# Patient Record
Sex: Female | Born: 1955 | Race: White | Hispanic: No | Smoking: Former smoker
Health system: Southern US, Community
[De-identification: ages and names within clinical notes are randomized; demographics above are authoritative.]

## PROBLEM LIST (undated history)

## (undated) DIAGNOSIS — R519 Headache, unspecified: Secondary | ICD-10-CM

## (undated) DIAGNOSIS — M5033 Other cervical disc degeneration, cervicothoracic region: Secondary | ICD-10-CM

## (undated) DIAGNOSIS — M5137 Other intervertebral disc degeneration, lumbosacral region: Secondary | ICD-10-CM

## (undated) DIAGNOSIS — M797 Fibromyalgia: Secondary | ICD-10-CM

## (undated) DIAGNOSIS — F329 Major depressive disorder, single episode, unspecified: Secondary | ICD-10-CM

## (undated) DIAGNOSIS — F32A Depression, unspecified: Secondary | ICD-10-CM

## (undated) DIAGNOSIS — M2550 Pain in unspecified joint: Secondary | ICD-10-CM

## (undated) DIAGNOSIS — M1990 Unspecified osteoarthritis, unspecified site: Secondary | ICD-10-CM

## (undated) DIAGNOSIS — R51 Headache: Secondary | ICD-10-CM

## (undated) DIAGNOSIS — M7550 Bursitis of unspecified shoulder: Secondary | ICD-10-CM

## (undated) HISTORY — PX: CARPAL TUNNEL RELEASE: SHX101

## (undated) HISTORY — DX: Fibromyalgia: M79.7

## (undated) HISTORY — PX: TONSILECTOMY, ADENOIDECTOMY, BILATERAL MYRINGOTOMY AND TUBES: SHX2538

## (undated) HISTORY — PX: TONSILLECTOMY: SUR1361

## (undated) HISTORY — DX: Pain in unspecified joint: M25.50

## (undated) HISTORY — DX: Major depressive disorder, single episode, unspecified: F32.9

## (undated) HISTORY — DX: Other intervertebral disc degeneration, lumbosacral region: M51.37

## (undated) HISTORY — DX: Unspecified osteoarthritis, unspecified site: M19.90

## (undated) HISTORY — PX: ABDOMINAL HYSTERECTOMY: SHX81

## (undated) HISTORY — PX: TONSILLECTOMY: SHX5217

## (undated) HISTORY — PX: KNEE SURGERY: SHX244

## (undated) HISTORY — DX: Bursitis of unspecified shoulder: M75.50

## (undated) HISTORY — DX: Other cervical disc degeneration, cervicothoracic region: M50.33

## (undated) HISTORY — DX: Depression, unspecified: F32.A

## (undated) HISTORY — PX: LASIK: SHX215

---

## 1979-02-10 HISTORY — PX: FOOT NEUROMA SURGERY: SHX646

## 1985-02-09 HISTORY — PX: ECTOPIC PREGNANCY SURGERY: SHX613

## 2004-02-10 HISTORY — PX: OTHER SURGICAL HISTORY: SHX169

## 2004-07-08 ENCOUNTER — Emergency Department: Payer: Self-pay | Admitting: Emergency Medicine

## 2004-08-17 ENCOUNTER — Encounter: Admission: RE | Admit: 2004-08-17 | Discharge: 2004-08-17 | Payer: Self-pay | Admitting: Orthopaedic Surgery

## 2004-09-25 ENCOUNTER — Emergency Department: Payer: Self-pay | Admitting: Emergency Medicine

## 2004-11-25 ENCOUNTER — Ambulatory Visit: Payer: Self-pay | Admitting: Internal Medicine

## 2008-01-24 ENCOUNTER — Ambulatory Visit: Payer: Self-pay | Admitting: Family Medicine

## 2008-02-10 HISTORY — PX: ROTATOR CUFF REPAIR: SHX139

## 2008-08-08 ENCOUNTER — Encounter: Admission: RE | Admit: 2008-08-08 | Discharge: 2008-08-08 | Payer: Self-pay | Admitting: Orthopaedic Surgery

## 2012-04-04 ENCOUNTER — Ambulatory Visit: Payer: Self-pay | Admitting: Internal Medicine

## 2012-04-19 ENCOUNTER — Ambulatory Visit (INDEPENDENT_AMBULATORY_CARE_PROVIDER_SITE_OTHER): Payer: Medicare Other | Admitting: Internal Medicine

## 2012-04-19 ENCOUNTER — Encounter: Payer: Self-pay | Admitting: Internal Medicine

## 2012-04-19 ENCOUNTER — Ambulatory Visit (INDEPENDENT_AMBULATORY_CARE_PROVIDER_SITE_OTHER)
Admission: RE | Admit: 2012-04-19 | Discharge: 2012-04-19 | Disposition: A | Discharge status: Home / Self Care | Payer: Medicare Other | Source: Ambulatory Visit | Attending: Internal Medicine | Admitting: Internal Medicine

## 2012-04-19 VITALS — BP 106/70 | HR 64 | Temp 98.3°F | Ht 69.25 in | Wt 153.0 lb

## 2012-04-19 DIAGNOSIS — M47817 Spondylosis without myelopathy or radiculopathy, lumbosacral region: Secondary | ICD-10-CM | POA: Diagnosis not present

## 2012-04-19 DIAGNOSIS — Z1239 Encounter for other screening for malignant neoplasm of breast: Secondary | ICD-10-CM

## 2012-04-19 DIAGNOSIS — M5137 Other intervertebral disc degeneration, lumbosacral region: Secondary | ICD-10-CM | POA: Diagnosis not present

## 2012-04-19 DIAGNOSIS — D649 Anemia, unspecified: Secondary | ICD-10-CM

## 2012-04-19 DIAGNOSIS — M545 Low back pain, unspecified: Secondary | ICD-10-CM

## 2012-04-19 DIAGNOSIS — K5900 Constipation, unspecified: Secondary | ICD-10-CM

## 2012-04-19 DIAGNOSIS — F32A Depression, unspecified: Secondary | ICD-10-CM | POA: Insufficient documentation

## 2012-04-19 DIAGNOSIS — E785 Hyperlipidemia, unspecified: Secondary | ICD-10-CM

## 2012-04-19 DIAGNOSIS — Z1211 Encounter for screening for malignant neoplasm of colon: Secondary | ICD-10-CM

## 2012-04-19 DIAGNOSIS — G8929 Other chronic pain: Secondary | ICD-10-CM | POA: Diagnosis not present

## 2012-04-19 DIAGNOSIS — F329 Major depressive disorder, single episode, unspecified: Secondary | ICD-10-CM

## 2012-04-19 DIAGNOSIS — Z0000 Encounter for general adult medical examination without abnormal findings: Secondary | ICD-10-CM

## 2012-04-19 LAB — CBC WITH DIFFERENTIAL/PLATELET
Basophils Relative: 1.1 % (ref 0.0–3.0)
Eosinophils Relative: 4.6 % (ref 0.0–5.0)
HCT: 39.1 % (ref 36.0–46.0)
Hemoglobin: 13.2 g/dL (ref 12.0–15.0)
Lymphocytes Relative: 27.6 % (ref 12.0–46.0)
Lymphs Abs: 1.7 10*3/uL (ref 0.7–4.0)
MCHC: 33.9 g/dL (ref 30.0–36.0)
MCV: 93 fl (ref 78.0–100.0)
Monocytes Relative: 5.3 % (ref 3.0–12.0)
Neutrophils Relative %: 61.4 % (ref 43.0–77.0)
RBC: 4.2 Mil/uL (ref 3.87–5.11)
WBC: 6.2 10*3/uL (ref 4.5–10.5)

## 2012-04-19 LAB — LIPID PANEL
HDL: 66.5 mg/dL (ref >39.00)
Total CHOL/HDL Ratio: 3
Triglycerides: 103 mg/dL (ref 0.0–149.0)

## 2012-04-19 LAB — COMPREHENSIVE METABOLIC PANEL
ALT: 20 U/L (ref 0–35)
Albumin: 4.4 g/dL (ref 3.5–5.2)
CO2: 26 mEq/L (ref 19–32)
Chloride: 104 mEq/L (ref 96–112)
Sodium: 140 mEq/L (ref 135–145)
Total Protein: 7.2 g/dL (ref 6.0–8.3)

## 2012-04-19 MED ORDER — DULOXETINE HCL 30 MG PO CPEP: 30.0000 mg | ORAL_CAPSULE | Freq: Every day | ORAL | Status: DC

## 2012-04-19 NOTE — Assessment & Plan Note (Signed)
Mammogram to be scheduled 

## 2012-04-19 NOTE — Progress Notes (Signed)
Subjective:    Patient ID: April [name], female    DOB: 1955/02/24, 57 y.o.   MRN: [id number]  HPI 57 year old female with history of fibromyalgia, chronic pain, depression presents to establish care. She reports that she was "fired" by her previous physician because of ongoing issues with chronic pain and medications. She reports that she has pain "all over her "which has been attributed to fibromyalgia. Pain is most prominent in her left shoulder and lower back. 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 This also contributes to pain symptoms. Pain is described as severe aching with occasional cramping. It is not improved with over-the-counter medications or Lyrica. She reports there is an improvement with narcotic medications including Dilaudid. She denies that she is currently using these medications.  She reports ongoing symptoms of depression related to pain issues. She is not currently on any medication for depression. In the past, she took Celexa with some improvement.  Outpatient Encounter Prescriptions as of 04/19/2012  Medication Sig Dispense Refill  . DULoxetine (CYMBALTA) 30 MG capsule Take 1 capsule (30 mg total) by mouth daily.  30 capsule  3   No facility-administered encounter medications on file as of 04/19/2012.   BP 106/70  Pulse 64  Temp(Src) 98.3 F (36.8 C) (Oral)  Ht 5' 9.25" (1.759 m)  Wt 153 lb (69.4 kg)  BMI 22.43 kg/m2  SpO2 99%   Review of Systems  Constitutional: Negative for fever, chills, appetite change, fatigue and unexpected weight change.  HENT: Negative for ear pain, congestion, sore throat, trouble swallowing, neck pain, voice change and sinus pressure.   Eyes: Negative for visual disturbance.  Respiratory: Negative for cough, shortness of breath, wheezing and stridor.   Cardiovascular: Negative for chest pain, palpitations and leg swelling.  Gastrointestinal: Positive for constipation.  Negative for nausea, vomiting, abdominal pain, diarrhea, blood in stool, abdominal distention and anal bleeding.  Genitourinary: Negative for dysuria and flank pain.  Musculoskeletal: Positive for myalgias, back pain and arthralgias. Negative for gait problem.  Skin: Negative for color change and rash.  Neurological: Negative for dizziness and headaches.  Hematological: Negative for adenopathy. Does not bruise/bleed easily.  Psychiatric/Behavioral: Positive for dysphoric mood. Negative for suicidal ideas and sleep disturbance. The patient is not nervous/anxious.        Objective:   Physical Exam  Constitutional: She is oriented to person, place, and time. She appears well-developed and well-nourished. No distress.  HENT:  Head: Normocephalic and atraumatic.  Right Ear: External ear normal.  Left Ear: External ear normal.  Nose: Nose normal.  Mouth/Throat: Oropharynx is clear and moist. No oropharyngeal exudate.  Eyes: Conjunctivae are normal. Pupils are equal, round, and reactive to light. Right eye exhibits no discharge. Left eye exhibits no discharge. No scleral icterus.  Neck: Normal range of motion. Neck supple. No tracheal deviation present. No thyromegaly present.  Cardiovascular: Normal rate, regular rhythm, normal heart sounds and intact distal pulses.  Exam reveals no gallop and no friction rub.   No murmur heard. Pulmonary/Chest: Effort normal and breath sounds normal. No respiratory distress. She has no wheezes. She has no rales. She exhibits no tenderness.  Musculoskeletal: She exhibits no edema and no tenderness.       Right shoulder: She exhibits pain.       Left shoulder: She exhibits pain.       Right knee: She exhibits normal range of motion and no swelling.  Left knee: She exhibits normal range of motion and no swelling.       Lumbar back: She exhibits tenderness, pain and spasm. She exhibits normal range of motion.  Lymphadenopathy:    She has no cervical  adenopathy.  Neurological: She is alert and oriented to person, place, and time. No cranial nerve deficit. She exhibits normal muscle tone. Coordination normal.  Skin: Skin is warm and dry. No rash noted. She is not diaphoretic. No erythema. No pallor.  Psychiatric: Judgment and thought content normal. Her mood appears anxious. Her speech is slurred. She is agitated. She expresses no suicidal ideation. She expresses no suicidal plans.          Assessment & Plan:

## 2012-04-19 NOTE — Assessment & Plan Note (Signed)
Patient reports severe lower back pain which has been refractory to nonsteroidal medications and to Lyrica. 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 Will get plain xray for further evaluation.

## 2012-04-19 NOTE — Assessment & Plan Note (Signed)
Depressed mood likely secondary to ongoing issues with pain control. Will start Cymbalta 30 mg daily. Followup in one month.

## 2012-04-19 NOTE — Assessment & Plan Note (Signed)
Symptoms of chronic constipation. Question if this may be secondary to medications used in the past. Discussed using laxatives such as MiraLax. Will also set up screening colonoscopy.

## 2012-04-19 NOTE — Assessment & Plan Note (Addendum)
Patient with symptoms of diffuse chronic pain, both arthralgia and myalgia. Symptoms are most consistent with fibromyalgia. No improvement with OTC meds, Lyrica. Patient initially suggested the use of narcotic medications to control pain.Discussed with pt that I will not prescribe her narcotic medications.  We discussed using Cymbalta to go to help control pain symptoms and help with depression. Will start this medication and followup in 4 weeks. Will also make referral to pain clinic for ongoing management. Will request records from her previous physician.

## 2012-04-19 NOTE — Assessment & Plan Note (Signed)
Colonoscopy scheduled.

## 2012-04-20 ENCOUNTER — Encounter: Payer: Self-pay | Admitting: *Deleted

## 2012-04-20 ENCOUNTER — Telehealth: Payer: Self-pay | Admitting: *Deleted

## 2012-04-20 LAB — DRUG SCREEN, URINE
Amphetamine Screen, Ur: NEGATIVE
Benzodiazepines.: NEGATIVE
Marijuana Metabolite: NEGATIVE
Opiates: NEGATIVE
Phencyclidine (PCP): NEGATIVE

## 2012-04-20 NOTE — Telephone Encounter (Signed)
There is not a less expensive alternative for Cymbalta. And, she has failed Lyrica and Neurontin in the past. I would recommend waiting until evaluation at pain clinic. We could try prn Tramadol if she has not tried this in the past?

## 2012-04-20 NOTE — Telephone Encounter (Signed)
Pt called saying she would like to change they medication Cymbalta, it will cost $210.99 and to notified of the new medication she will be getting and also that she got a call from the pain clinic, she has an appointment for 04.02.2014

## 2012-04-20 NOTE — Telephone Encounter (Signed)
Pt called about her lab results, pt was notified

## 2012-04-21 MED ORDER — CITALOPRAM HYDROBROMIDE 20 MG PO TABS: 20.0000 mg | ORAL_TABLET | Freq: Every day | ORAL | Status: DC

## 2012-04-21 MED ORDER — TRAMADOL HCL 50 MG PO TABS: 50.0000 mg | ORAL_TABLET | Freq: Two times a day (BID) | ORAL | Status: DC | PRN

## 2012-04-21 NOTE — Telephone Encounter (Addendum)
She has been on Citalopram and she gradually increased it to 40 mg and it was working for the depression but not the pain. But as long as it working for her depression then she would like to go back on it since she does have an appointment with the pain clinic on 05/11/12. Also would like to try the Tramadol as well prn. She was given her xray results and she would like to be referred to PT

## 2012-04-21 NOTE — Telephone Encounter (Signed)
LMTCB

## 2012-04-21 NOTE — Telephone Encounter (Signed)
OK. Fine to re-start Citalopram 20mg  daily. #30 with 3 refills. We will increase to 40mg  in the future. Fine to call in Tramadol 50mg  po bid prn. #60 with 3 refills.

## 2012-04-21 NOTE — Telephone Encounter (Signed)
Patient aware and would like Rx faxed to [facility] in [place]. Rx printed signed and faxed.

## 2012-04-22 ENCOUNTER — Telehealth: Payer: Self-pay | Admitting: Emergency Medicine

## 2012-04-29 DIAGNOSIS — M25569 Pain in unspecified knee: Secondary | ICD-10-CM | POA: Diagnosis not present

## 2012-04-29 DIAGNOSIS — M79609 Pain in unspecified limb: Secondary | ICD-10-CM | POA: Diagnosis not present

## 2012-04-29 DIAGNOSIS — IMO0001 Reserved for inherently not codable concepts without codable children: Secondary | ICD-10-CM | POA: Diagnosis not present

## 2012-04-29 DIAGNOSIS — M47817 Spondylosis without myelopathy or radiculopathy, lumbosacral region: Secondary | ICD-10-CM | POA: Diagnosis not present

## 2012-04-29 DIAGNOSIS — Z79899 Other long term (current) drug therapy: Secondary | ICD-10-CM | POA: Diagnosis not present

## 2012-04-29 DIAGNOSIS — G8929 Other chronic pain: Secondary | ICD-10-CM | POA: Diagnosis not present

## 2012-04-29 DIAGNOSIS — M545 Low back pain, unspecified: Secondary | ICD-10-CM | POA: Diagnosis not present

## 2012-05-04 ENCOUNTER — Ambulatory Visit: Payer: Self-pay | Admitting: Internal Medicine

## 2012-05-04 DIAGNOSIS — Z1231 Encounter for screening mammogram for malignant neoplasm of breast: Secondary | ICD-10-CM | POA: Diagnosis not present

## 2012-05-09 DIAGNOSIS — K5900 Constipation, unspecified: Secondary | ICD-10-CM | POA: Diagnosis not present

## 2012-05-09 DIAGNOSIS — F341 Dysthymic disorder: Secondary | ICD-10-CM | POA: Diagnosis not present

## 2012-05-09 DIAGNOSIS — IMO0002 Reserved for concepts with insufficient information to code with codable children: Secondary | ICD-10-CM | POA: Diagnosis not present

## 2012-05-10 DIAGNOSIS — M75120 Complete rotator cuff tear or rupture of unspecified shoulder, not specified as traumatic: Secondary | ICD-10-CM | POA: Diagnosis not present

## 2012-05-10 DIAGNOSIS — M19019 Primary osteoarthritis, unspecified shoulder: Secondary | ICD-10-CM | POA: Diagnosis not present

## 2012-05-10 DIAGNOSIS — M25519 Pain in unspecified shoulder: Secondary | ICD-10-CM | POA: Diagnosis not present

## 2012-05-19 ENCOUNTER — Encounter: Payer: Self-pay | Admitting: Internal Medicine

## 2012-06-01 DIAGNOSIS — Z1211 Encounter for screening for malignant neoplasm of colon: Secondary | ICD-10-CM | POA: Diagnosis not present

## 2012-06-07 DIAGNOSIS — S46819A Strain of other muscles, fascia and tendons at shoulder and upper arm level, unspecified arm, initial encounter: Secondary | ICD-10-CM | POA: Diagnosis not present

## 2012-06-07 DIAGNOSIS — M25519 Pain in unspecified shoulder: Secondary | ICD-10-CM | POA: Diagnosis not present

## 2012-06-08 ENCOUNTER — Encounter: Payer: Self-pay | Admitting: Emergency Medicine

## 2012-06-13 DIAGNOSIS — L708 Other acne: Secondary | ICD-10-CM | POA: Diagnosis not present

## 2012-06-13 DIAGNOSIS — L739 Follicular disorder, unspecified: Secondary | ICD-10-CM | POA: Diagnosis not present

## 2012-06-13 DIAGNOSIS — L723 Sebaceous cyst: Secondary | ICD-10-CM | POA: Diagnosis not present

## 2012-06-13 DIAGNOSIS — L821 Other seborrheic keratosis: Secondary | ICD-10-CM | POA: Diagnosis not present

## 2012-06-17 DIAGNOSIS — M47817 Spondylosis without myelopathy or radiculopathy, lumbosacral region: Secondary | ICD-10-CM | POA: Diagnosis not present

## 2012-06-17 DIAGNOSIS — IMO0001 Reserved for inherently not codable concepts without codable children: Secondary | ICD-10-CM | POA: Diagnosis not present

## 2012-06-17 DIAGNOSIS — M797 Fibromyalgia: Secondary | ICD-10-CM | POA: Insufficient documentation

## 2012-06-21 ENCOUNTER — Encounter: Payer: Self-pay | Admitting: Emergency Medicine

## 2012-07-12 DIAGNOSIS — G5600 Carpal tunnel syndrome, unspecified upper limb: Secondary | ICD-10-CM | POA: Diagnosis not present

## 2012-07-12 DIAGNOSIS — K5900 Constipation, unspecified: Secondary | ICD-10-CM | POA: Diagnosis not present

## 2012-07-12 DIAGNOSIS — F341 Dysthymic disorder: Secondary | ICD-10-CM | POA: Diagnosis not present

## 2012-07-12 DIAGNOSIS — G4700 Insomnia, unspecified: Secondary | ICD-10-CM | POA: Diagnosis not present

## 2012-07-19 ENCOUNTER — Ambulatory Visit (INDEPENDENT_AMBULATORY_CARE_PROVIDER_SITE_OTHER): Payer: Medicare Other

## 2012-07-19 DIAGNOSIS — G5600 Carpal tunnel syndrome, unspecified upper limb: Secondary | ICD-10-CM

## 2012-07-19 DIAGNOSIS — G5602 Carpal tunnel syndrome, left upper limb: Secondary | ICD-10-CM

## 2012-07-19 NOTE — Procedures (Signed)
  HISTORY:  April [name] is a 57 year old patient with a history of numbness and tingling sensations in the radial aspect of the palm of the left hand. The patient has some pain radiating up the forearm to the shoulder level. The patient is being evaluated for possible carpal tunnel syndrome. The patient has prior history of carpal tunnel surgery on the right side, not the left.  NERVE CONDUCTION STUDIES:  Nerve conduction studies were performed on the left upper extremity. The distal motor latency for the left median nerve was prolonged, with a normal motor amplitude. The distal motor latency and motor amplitudes for the left ulnar nerve were normal. The F wave latencies and nerve conduction velocities for the left median and ulnar nerves were normal. The left median sensory latency was prolonged, normal for the left ulnar nerve.  EMG STUDIES:  EMG evaluation was not performed.  IMPRESSION:   Nerve conduction studies performed on the left upper extremity shows findings consistent with a mild left carpal tunnel syndrome. No other significant abnormalities were seen.  Marlan Palau MD 07/19/2012 1:02 PM  [facility] [address] [place], [place] [address]  Phone [phone] Fax [phone]

## 2012-07-29 DIAGNOSIS — Z0289 Encounter for other administrative examinations: Secondary | ICD-10-CM

## 2012-08-19 ENCOUNTER — Other Ambulatory Visit: Payer: Self-pay | Admitting: Internal Medicine

## 2012-09-06 DIAGNOSIS — M2550 Pain in unspecified joint: Secondary | ICD-10-CM | POA: Diagnosis not present

## 2012-09-06 DIAGNOSIS — F341 Dysthymic disorder: Secondary | ICD-10-CM | POA: Diagnosis not present

## 2012-10-27 DIAGNOSIS — G5600 Carpal tunnel syndrome, unspecified upper limb: Secondary | ICD-10-CM | POA: Diagnosis not present

## 2012-11-08 DIAGNOSIS — G5600 Carpal tunnel syndrome, unspecified upper limb: Secondary | ICD-10-CM | POA: Diagnosis not present

## 2012-12-14 DIAGNOSIS — M25549 Pain in joints of unspecified hand: Secondary | ICD-10-CM | POA: Diagnosis not present

## 2012-12-15 ENCOUNTER — Other Ambulatory Visit: Payer: Self-pay

## 2012-12-30 DIAGNOSIS — M545 Low back pain: Secondary | ICD-10-CM | POA: Diagnosis not present

## 2012-12-30 DIAGNOSIS — R42 Dizziness and giddiness: Secondary | ICD-10-CM | POA: Diagnosis not present

## 2013-01-11 DIAGNOSIS — M25559 Pain in unspecified hip: Secondary | ICD-10-CM | POA: Diagnosis not present

## 2013-01-11 DIAGNOSIS — M25569 Pain in unspecified knee: Secondary | ICD-10-CM | POA: Diagnosis not present

## 2013-01-23 DIAGNOSIS — M79609 Pain in unspecified limb: Secondary | ICD-10-CM | POA: Diagnosis not present

## 2013-01-25 DIAGNOSIS — M5137 Other intervertebral disc degeneration, lumbosacral region: Secondary | ICD-10-CM | POA: Diagnosis not present

## 2013-01-25 DIAGNOSIS — M961 Postlaminectomy syndrome, not elsewhere classified: Secondary | ICD-10-CM | POA: Diagnosis not present

## 2013-01-25 DIAGNOSIS — M542 Cervicalgia: Secondary | ICD-10-CM | POA: Diagnosis not present

## 2013-01-27 DIAGNOSIS — M5137 Other intervertebral disc degeneration, lumbosacral region: Secondary | ICD-10-CM | POA: Diagnosis not present

## 2013-01-30 DIAGNOSIS — M5137 Other intervertebral disc degeneration, lumbosacral region: Secondary | ICD-10-CM | POA: Diagnosis not present

## 2013-02-06 DIAGNOSIS — M5137 Other intervertebral disc degeneration, lumbosacral region: Secondary | ICD-10-CM | POA: Diagnosis not present

## 2013-02-13 DIAGNOSIS — M5137 Other intervertebral disc degeneration, lumbosacral region: Secondary | ICD-10-CM | POA: Diagnosis not present

## 2013-02-22 DIAGNOSIS — M25569 Pain in unspecified knee: Secondary | ICD-10-CM | POA: Diagnosis not present

## 2013-02-23 DIAGNOSIS — M961 Postlaminectomy syndrome, not elsewhere classified: Secondary | ICD-10-CM | POA: Diagnosis not present

## 2013-02-23 DIAGNOSIS — M545 Low back pain, unspecified: Secondary | ICD-10-CM | POA: Diagnosis not present

## 2013-02-23 DIAGNOSIS — M47817 Spondylosis without myelopathy or radiculopathy, lumbosacral region: Secondary | ICD-10-CM | POA: Diagnosis not present

## 2013-02-23 DIAGNOSIS — M461 Sacroiliitis, not elsewhere classified: Secondary | ICD-10-CM | POA: Diagnosis not present

## 2013-03-01 DIAGNOSIS — M25569 Pain in unspecified knee: Secondary | ICD-10-CM | POA: Diagnosis not present

## 2013-03-01 DIAGNOSIS — M47817 Spondylosis without myelopathy or radiculopathy, lumbosacral region: Secondary | ICD-10-CM | POA: Diagnosis not present

## 2013-03-16 DIAGNOSIS — M545 Low back pain, unspecified: Secondary | ICD-10-CM | POA: Diagnosis not present

## 2013-03-16 DIAGNOSIS — M461 Sacroiliitis, not elsewhere classified: Secondary | ICD-10-CM | POA: Diagnosis not present

## 2013-03-16 DIAGNOSIS — M961 Postlaminectomy syndrome, not elsewhere classified: Secondary | ICD-10-CM | POA: Diagnosis not present

## 2013-03-16 DIAGNOSIS — M47817 Spondylosis without myelopathy or radiculopathy, lumbosacral region: Secondary | ICD-10-CM | POA: Diagnosis not present

## 2013-03-22 DIAGNOSIS — M461 Sacroiliitis, not elsewhere classified: Secondary | ICD-10-CM | POA: Diagnosis not present

## 2013-03-27 DIAGNOSIS — M545 Low back pain, unspecified: Secondary | ICD-10-CM | POA: Diagnosis not present

## 2013-03-27 DIAGNOSIS — F341 Dysthymic disorder: Secondary | ICD-10-CM | POA: Diagnosis not present

## 2013-03-27 DIAGNOSIS — Z23 Encounter for immunization: Secondary | ICD-10-CM | POA: Diagnosis not present

## 2013-03-27 DIAGNOSIS — L989 Disorder of the skin and subcutaneous tissue, unspecified: Secondary | ICD-10-CM | POA: Diagnosis not present

## 2013-04-20 DIAGNOSIS — M545 Low back pain, unspecified: Secondary | ICD-10-CM | POA: Diagnosis not present

## 2013-04-20 DIAGNOSIS — M542 Cervicalgia: Secondary | ICD-10-CM | POA: Diagnosis not present

## 2013-04-25 DIAGNOSIS — M5137 Other intervertebral disc degeneration, lumbosacral region: Secondary | ICD-10-CM | POA: Diagnosis not present

## 2013-04-25 DIAGNOSIS — M542 Cervicalgia: Secondary | ICD-10-CM | POA: Diagnosis not present

## 2013-04-25 DIAGNOSIS — M51379 Other intervertebral disc degeneration, lumbosacral region without mention of lumbar back pain or lower extremity pain: Secondary | ICD-10-CM | POA: Diagnosis not present

## 2013-04-28 ENCOUNTER — Other Ambulatory Visit: Payer: Self-pay | Admitting: Orthopedic Surgery

## 2013-04-28 DIAGNOSIS — M549 Dorsalgia, unspecified: Secondary | ICD-10-CM

## 2013-04-28 DIAGNOSIS — F341 Dysthymic disorder: Secondary | ICD-10-CM | POA: Diagnosis not present

## 2013-04-28 DIAGNOSIS — IMO0002 Reserved for concepts with insufficient information to code with codable children: Secondary | ICD-10-CM | POA: Diagnosis not present

## 2013-05-03 ENCOUNTER — Other Ambulatory Visit: Payer: Self-pay | Admitting: Orthopedic Surgery

## 2013-05-03 ENCOUNTER — Inpatient Hospital Stay
Admission: RE | Admit: 2013-05-03 | Discharge: 2013-05-03 | Disposition: A | Discharge status: Home / Self Care | Payer: Self-pay | Source: Ambulatory Visit | Attending: Orthopedic Surgery | Admitting: Orthopedic Surgery

## 2013-05-03 ENCOUNTER — Ambulatory Visit
Admission: RE | Admit: 2013-05-03 | Discharge: 2013-05-03 | Disposition: A | Discharge status: Home / Self Care | Payer: Medicare Other | Source: Ambulatory Visit | Attending: Orthopedic Surgery | Admitting: Orthopedic Surgery

## 2013-05-03 VITALS — BP 146/84 | HR 71 | Resp 14

## 2013-05-03 DIAGNOSIS — M549 Dorsalgia, unspecified: Secondary | ICD-10-CM

## 2013-05-03 DIAGNOSIS — M5137 Other intervertebral disc degeneration, lumbosacral region: Secondary | ICD-10-CM | POA: Diagnosis not present

## 2013-05-03 DIAGNOSIS — M545 Low back pain, unspecified: Secondary | ICD-10-CM

## 2013-05-03 DIAGNOSIS — M48061 Spinal stenosis, lumbar region without neurogenic claudication: Secondary | ICD-10-CM | POA: Diagnosis not present

## 2013-05-03 MED ORDER — ONDANSETRON HCL 4 MG/2ML IJ SOLN
4.0000 mg | Freq: Once | INTRAMUSCULAR | Status: AC
Start: 2013-05-03 — End: 2013-05-03
  Administered 2013-05-03: 4 mg via INTRAMUSCULAR

## 2013-05-03 MED ORDER — FENTANYL CITRATE 0.05 MG/ML IJ SOLN
25.0000 ug | INTRAMUSCULAR | Status: DC | PRN
  Administered 2013-05-03: 100 ug via INTRAVENOUS

## 2013-05-03 MED ORDER — IOHEXOL 180 MG/ML  SOLN
4.0000 mL | Freq: Once | INTRAMUSCULAR | Status: AC | PRN
  Administered 2013-05-03: 4 mL

## 2013-05-03 MED ORDER — KETOROLAC TROMETHAMINE 30 MG/ML IJ SOLN
30.0000 mg | Freq: Once | INTRAMUSCULAR | Status: AC
  Administered 2013-05-03: 30 mg via INTRAVENOUS

## 2013-05-03 MED ORDER — MIDAZOLAM HCL 2 MG/2ML IJ SOLN
1.0000 mg | INTRAMUSCULAR | Status: DC | PRN
  Administered 2013-05-03: 1 mg via INTRAVENOUS

## 2013-05-03 MED ORDER — MEPERIDINE HCL 100 MG/ML IJ SOLN
75.0000 mg | Freq: Once | INTRAMUSCULAR | Status: AC
  Administered 2013-05-03: 75 mg via INTRAMUSCULAR

## 2013-05-03 MED ORDER — SODIUM CHLORIDE 0.9 % IV SOLN
Freq: Once | INTRAVENOUS | Status: AC
  Administered 2013-05-03: 08:00:00 via INTRAVENOUS

## 2013-05-03 MED ORDER — CEFAZOLIN SODIUM-DEXTROSE 2-3 GM-% IV SOLR
2.0000 g | Freq: Once | INTRAVENOUS | Status: AC
  Administered 2013-05-03: 2 g via INTRAVENOUS

## 2013-05-03 NOTE — Discharge Instructions (Signed)
Discogram Post Procedure Discharge Instructions ° °1. May resume a regular diet and any medications that you routinely take (including pain medications). °2. No driving day of procedure. °3. Upon discharge go home and rest for at least 4 hours.  May use an ice pack as needed to injection sites on back.  Ice to back 30 minutes on and 30 minutes off, all day. °4. May remove bandades later, today. °5. It is not unusual to be sore for several days after this procedure. ° ° ° °Please contact our office at [phone] for the following symptoms: ° °· Fever greater than 100 degrees °· Increased swelling, pain, or redness at injection site. ° ° °Thank you for visiting [facility]. ° ° °

## 2013-05-18 DIAGNOSIS — M25569 Pain in unspecified knee: Secondary | ICD-10-CM | POA: Diagnosis not present

## 2013-05-18 DIAGNOSIS — M2240 Chondromalacia patellae, unspecified knee: Secondary | ICD-10-CM | POA: Diagnosis not present

## 2013-05-18 DIAGNOSIS — Y929 Unspecified place or not applicable: Secondary | ICD-10-CM | POA: Diagnosis not present

## 2013-05-18 DIAGNOSIS — G8918 Other acute postprocedural pain: Secondary | ICD-10-CM | POA: Diagnosis not present

## 2013-05-18 DIAGNOSIS — X58XXXA Exposure to other specified factors, initial encounter: Secondary | ICD-10-CM | POA: Diagnosis not present

## 2013-05-18 DIAGNOSIS — M23305 Other meniscus derangements, unspecified medial meniscus, unspecified knee: Secondary | ICD-10-CM | POA: Diagnosis not present

## 2013-05-18 DIAGNOSIS — M1710 Unilateral primary osteoarthritis, unspecified knee: Secondary | ICD-10-CM | POA: Diagnosis not present

## 2013-05-18 DIAGNOSIS — M9420 Chondromalacia, unspecified site: Secondary | ICD-10-CM | POA: Diagnosis not present

## 2013-05-18 DIAGNOSIS — IMO0002 Reserved for concepts with insufficient information to code with codable children: Secondary | ICD-10-CM | POA: Diagnosis not present

## 2013-06-23 DIAGNOSIS — M129 Arthropathy, unspecified: Secondary | ICD-10-CM | POA: Diagnosis not present

## 2013-06-23 DIAGNOSIS — Z9889 Other specified postprocedural states: Secondary | ICD-10-CM | POA: Diagnosis not present

## 2013-06-23 DIAGNOSIS — M25569 Pain in unspecified knee: Secondary | ICD-10-CM | POA: Diagnosis not present

## 2013-09-12 DIAGNOSIS — M542 Cervicalgia: Secondary | ICD-10-CM | POA: Diagnosis not present

## 2013-09-15 ENCOUNTER — Other Ambulatory Visit: Payer: Self-pay | Admitting: Orthopedic Surgery

## 2013-09-15 DIAGNOSIS — M5020 Other cervical disc displacement, unspecified cervical region: Secondary | ICD-10-CM

## 2013-09-15 DIAGNOSIS — M48061 Spinal stenosis, lumbar region without neurogenic claudication: Secondary | ICD-10-CM

## 2013-09-15 DIAGNOSIS — M542 Cervicalgia: Secondary | ICD-10-CM

## 2013-09-26 ENCOUNTER — Other Ambulatory Visit: Payer: Medicare Other

## 2013-10-03 ENCOUNTER — Ambulatory Visit
Admission: RE | Admit: 2013-10-03 | Discharge: 2013-10-03 | Disposition: A | Discharge status: Home / Self Care | Payer: Medicare Other | Source: Ambulatory Visit | Attending: Orthopedic Surgery | Admitting: Orthopedic Surgery

## 2013-10-03 ENCOUNTER — Ambulatory Visit
Admission: RE | Admit: 2013-10-03 | Discharge: 2013-10-03 | Disposition: A | Discharge status: Home / Self Care | Payer: Self-pay | Source: Ambulatory Visit | Attending: Orthopedic Surgery | Admitting: Orthopedic Surgery

## 2013-10-03 ENCOUNTER — Other Ambulatory Visit: Payer: Self-pay | Admitting: Orthopedic Surgery

## 2013-10-03 VITALS — BP 141/81 | HR 62

## 2013-10-03 DIAGNOSIS — M542 Cervicalgia: Secondary | ICD-10-CM

## 2013-10-03 DIAGNOSIS — M5020 Other cervical disc displacement, unspecified cervical region: Secondary | ICD-10-CM

## 2013-10-03 DIAGNOSIS — M47812 Spondylosis without myelopathy or radiculopathy, cervical region: Secondary | ICD-10-CM | POA: Diagnosis not present

## 2013-10-03 DIAGNOSIS — M4802 Spinal stenosis, cervical region: Secondary | ICD-10-CM | POA: Diagnosis not present

## 2013-10-03 DIAGNOSIS — M4310 Spondylolisthesis, site unspecified: Secondary | ICD-10-CM | POA: Diagnosis not present

## 2013-10-03 DIAGNOSIS — M48061 Spinal stenosis, lumbar region without neurogenic claudication: Secondary | ICD-10-CM

## 2013-10-03 MED ORDER — IOHEXOL 300 MG/ML  SOLN
10.0000 mL | Freq: Once | INTRAMUSCULAR | Status: AC | PRN
  Administered 2013-10-03: 10 mL via INTRATHECAL

## 2013-10-03 MED ORDER — DIAZEPAM 5 MG PO TABS
10.0000 mg | ORAL_TABLET | Freq: Once | ORAL | Status: AC
  Administered 2013-10-03: 10 mg via ORAL

## 2013-10-03 MED ORDER — ONDANSETRON HCL 4 MG/2ML IJ SOLN
4.0000 mg | Freq: Once | INTRAMUSCULAR | Status: AC
  Administered 2013-10-03: 4 mg via INTRAMUSCULAR

## 2013-10-03 MED ORDER — MEPERIDINE HCL 100 MG/ML IJ SOLN
100.0000 mg | Freq: Once | INTRAMUSCULAR | Status: AC
  Administered 2013-10-03: 100 mg via INTRAMUSCULAR

## 2013-10-03 MED ORDER — ONDANSETRON HCL 4 MG/2ML IJ SOLN: 4.0000 mg | Freq: Four times a day (QID) | INTRAMUSCULAR | Status: DC | PRN

## 2013-10-03 NOTE — Discharge Instructions (Signed)
Myelogram Discharge Instructions  1. Go home and rest quietly for the next 24 hours.  It is important to lie flat for the next 24 hours.  Get up only to go to the restroom.  You may lie in the bed or on a couch on your back, your stomach, your left side or your right side.  You may have one pillow under your head.  You may have pillows between your knees while you are on your side or under your knees while you are on your back.  2. DO NOT drive today.  Recline the seat as far back as it will go, while still wearing your seat belt, on the way home.  3. You may get up to go to the bathroom as needed.  You may sit up for 10 minutes to eat.  You may resume your normal diet and medications unless otherwise indicated.  Drink lots of extra fluids today and tomorrow.  4. The incidence of headache, nausea, or vomiting is about 5% (one in 20 patients).  If you develop a headache, lie flat and drink plenty of fluids until the headache goes away.  Caffeinated beverages may be helpful.  If you develop severe nausea and vomiting or a headache that does not go away with flat bed rest, call [phone].  5. You may resume normal activities after your 24 hours of bed rest is over; however, do not exert yourself strongly or do any heavy lifting tomorrow. If when you get up you have a headache when standing, go back to bed and force fluids for another 24 hours.  6. Call your physician for a follow-up appointment.  The results of your myelogram will be sent directly to your physician by the following day.  7. If you have any questions or if complications develop after you arrive home, please call [phone].  Discharge instructions have been explained to the patient.  The patient, or the person responsible for the patient, fully understands these instructions.      May resume Fluoxetine/Prozac and Tramadol on Aug. 26, 2015, after 8:30 am.

## 2013-10-03 NOTE — Progress Notes (Signed)
Pt states she has been Prozac and Tramadol since Saturday. Discharge instructions explained to pt.

## 2013-10-13 DIAGNOSIS — M5126 Other intervertebral disc displacement, lumbar region: Secondary | ICD-10-CM | POA: Diagnosis not present

## 2013-10-13 DIAGNOSIS — M961 Postlaminectomy syndrome, not elsewhere classified: Secondary | ICD-10-CM | POA: Diagnosis not present

## 2013-10-30 DIAGNOSIS — F341 Dysthymic disorder: Secondary | ICD-10-CM | POA: Diagnosis not present

## 2013-10-30 DIAGNOSIS — IMO0002 Reserved for concepts with insufficient information to code with codable children: Secondary | ICD-10-CM | POA: Diagnosis not present

## 2013-10-30 DIAGNOSIS — Z23 Encounter for immunization: Secondary | ICD-10-CM | POA: Diagnosis not present

## 2013-11-17 ENCOUNTER — Encounter (HOSPITAL_COMMUNITY): Payer: Self-pay | Admitting: Pharmacy Technician

## 2013-11-20 DIAGNOSIS — M542 Cervicalgia: Secondary | ICD-10-CM | POA: Diagnosis not present

## 2013-11-20 NOTE — H&P (Addendum)
April [name] is an 58 y.o. female.   History of Present Illness The patient is a 58 year old female who comes in today for a preoperative history and physical. The patient is scheduled for a C5-7 fusion to be performed by Dr. Debria Garretahari D. Shon BatonBrooks, MD at [facility] on 11-22-13 .   Allergies ) No Known Drug Allergies 10/27/2012  Family History Osteoarthritis mother and grandmother mothers side Chronic Obstructive Lung Disease grandmother mothers side Depression First Degree Relatives. mother  Social History  Current work status disabled Illicit drug use no Children 3 Exercise Exercises daily; does other Alcohol use current drinker; drinks beer and wine; only occasionally per week Pain Contract no Marital status married Living situation live with spouse Tobacco use Former smoker. former smoker; smoke(d) 1 pack(s) per day Tobacco / smoke exposure no Drug/Alcohol Rehab (Previously) no Drug/Alcohol Rehab (Currently) no Number of flights of stairs before winded less than 1  Medication History) TraMADol HCl (50MG  Tablet, Oral) Active. (tid) FLUoxetine HCl (10MG  Tablet, Oral) Active. (qd (prozac)) Medications Reconciled  Past Surgical History Dilation and Curettage of Uterus - Multiple Rotator Cuff Repair left Arthroscopy of Knee bilateral Arthroscopy of Shoulder left Carpal Tunnel Repair right Hysterectomy partial (non-cancerous) Tonsillectomy Foot Surgery bilateral Spinal Fusion neck Neck Disc Surgery  Other Problems Peripheral Neuropathy Osteoarthritis Chronic Pain Anxiety Disorder Oophorectomy left Fibromyalgia Depression   Vitals) 11/20/2013 1:51 PM Weight: 133 lb Height: 69in Weight was reported by patient. Height was reported by patient. Body Surface Area: 1.71 m Body Mass Index: 19.64 kg/m Temp.: 98.55F(Oral)  Pulse: 62 (Regular)  BP: 118/68 (Sitting, Left Arm, Standard)  Past Medical History   Diagnosis Date  . Arthritis   . Arthralgia of multiple sites   . Bursitis, subacromial   . Other cervical disc degeneration, cervicothoracic region   . Fibromyalgia   . Depression   . Disc disease, degenerative, lumbar or lumbosacral     Past Surgical History  Procedure Laterality Date  . Abdominal hysterectomy    . Tonsilectomy, adenoidectomy, bilateral myringotomy and tubes    . Ectopic pregnancy surgery  1987  . Rotator cuff repair Left 2010  . Facet rhizotomy  2006  . Foot neuroma surgery Bilateral 1981  . Knee surgery      arthroscopic bilateral    Family History  Problem Relation Age of Onset  . Arthritis Mother   . Depression Mother   . Thyroid disease Sister   . Depression Sister   . Depression Sister    Social History:  reports that she quit smoking about 3 years ago. She has never used smokeless tobacco. She reports that she drinks alcohol. She reports that she does not use illicit drugs.  Allergies: No Known Allergies  No prescriptions prior to admission    No results found for this or any previous visit (from the past 48 hour(s)). No results found.  Review of Systems  Constitutional: Negative.   Eyes: Negative.   Respiratory: Negative.   Cardiovascular: Negative.   Gastrointestinal: Negative.   Genitourinary: Negative.   Musculoskeletal: Positive for neck pain.  Skin: Negative.   Endo/Heme/Allergies: Negative.   Psychiatric/Behavioral: Negative.     There were no vitals taken for this visit. Physical Exam  Constitutional: She is oriented to person, place, and time. She appears well-developed and well-nourished.  HENT:  Head: Normocephalic and atraumatic.  Eyes: EOM are normal. Pupils are equal, round, and reactive to light.  Neck: Normal range of motion. Neck supple.  Cardiovascular:  Normal rate and regular rhythm.   Respiratory: Effort normal and breath sounds normal.  GI: Soft. Bowel sounds are normal.  Musculoskeletal: She exhibits  tenderness.  Neurological: She is alert and oriented to person, place, and time.  Skin: Skin is warm and dry.  Psychiatric: She has a normal mood and affect.      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 Non-union, hardware failure, adjacent segment disease and recurrent pain. Hardware breakage, mal-position requiring surgery to correct or remove. OWENS,[name] 11/20/2013, 2:27 PM  Unfortunately she has an established nonunion of the neck which is most likely causing her ongoing pain. We have briefly discussed a posterior cervical instrumented fusion to address this and she has some concerns. With respect to the cervical spine I would recommend a posterior 5-7 instrumented fusion with iliac crest bone graft.  RADIOGRAPHS: At this point in time her CT myelogram of the cervical spine definitely shows a pseudoarthrosis at C5-6, C6-7 levels, no cervical plate present.  Spoke to patient about ICBG - she was concerned about this and expressed a desire not to have this portion of the procedure.  Given the history of chronic back pain and the fact that she has stop smoking ([id number]) I agreed to forgo ICBG and use local autograft with allograft supplementation.

## 2013-11-21 ENCOUNTER — Encounter (HOSPITAL_COMMUNITY): Payer: Self-pay

## 2013-11-21 ENCOUNTER — Encounter (HOSPITAL_COMMUNITY)
Admission: RE | Admit: 2013-11-21 | Discharge: 2013-11-21 | Disposition: A | Discharge status: Home / Self Care | Payer: Medicare Other | Source: Ambulatory Visit | Attending: Orthopedic Surgery | Admitting: Orthopedic Surgery

## 2013-11-21 ENCOUNTER — Encounter (HOSPITAL_COMMUNITY)
Admission: RE | Admit: 2013-11-21 | Discharge: 2013-11-21 | Disposition: A | Discharge status: Home / Self Care | Payer: Medicare Other | Source: Ambulatory Visit | Attending: Surgery | Admitting: Surgery

## 2013-11-21 DIAGNOSIS — Z01818 Encounter for other preprocedural examination: Secondary | ICD-10-CM

## 2013-11-21 DIAGNOSIS — M1990 Unspecified osteoarthritis, unspecified site: Secondary | ICD-10-CM | POA: Diagnosis not present

## 2013-11-21 DIAGNOSIS — F329 Major depressive disorder, single episode, unspecified: Secondary | ICD-10-CM | POA: Diagnosis not present

## 2013-11-21 DIAGNOSIS — M797 Fibromyalgia: Secondary | ICD-10-CM | POA: Diagnosis not present

## 2013-11-21 DIAGNOSIS — Z87891 Personal history of nicotine dependence: Secondary | ICD-10-CM | POA: Diagnosis not present

## 2013-11-21 DIAGNOSIS — Z79899 Other long term (current) drug therapy: Secondary | ICD-10-CM | POA: Diagnosis not present

## 2013-11-21 DIAGNOSIS — M960 Pseudarthrosis after fusion or arthrodesis: Secondary | ICD-10-CM | POA: Diagnosis present

## 2013-11-21 HISTORY — DX: Headache: R51

## 2013-11-21 HISTORY — DX: Headache, unspecified: R51.9

## 2013-11-21 LAB — URINALYSIS, ROUTINE W REFLEX MICROSCOPIC
Bilirubin Urine: NEGATIVE
Glucose, UA: NEGATIVE mg/dL
HGB URINE DIPSTICK: NEGATIVE
Ketones, ur: NEGATIVE mg/dL
LEUKOCYTES UA: NEGATIVE
NITRITE: NEGATIVE
PROTEIN: NEGATIVE mg/dL
SPECIFIC GRAVITY, URINE: 1.02 (ref 1.005–1.030)
Urobilinogen, UA: 0.2 mg/dL (ref 0.0–1.0)
pH: 7 (ref 5.0–8.0)

## 2013-11-21 LAB — COMPREHENSIVE METABOLIC PANEL
ALBUMIN: 4.3 g/dL (ref 3.5–5.2)
ALT: 16 U/L (ref 0–35)
AST: 16 U/L (ref 0–37)
Alkaline Phosphatase: 53 U/L (ref 39–117)
Anion gap: 12 (ref 5–15)
BILIRUBIN TOTAL: 0.3 mg/dL (ref 0.3–1.2)
BUN: 16 mg/dL (ref 6–23)
CHLORIDE: 103 meq/L (ref 96–112)
CO2: 28 mEq/L (ref 19–32)
Calcium: 9.7 mg/dL (ref 8.4–10.5)
Creatinine, Ser: 0.79 mg/dL (ref 0.50–1.10)
GFR calc Af Amer: >90 mL/min (ref >90)
GFR calc non Af Amer: >90 mL/min (ref >90)
Glucose, Bld: 87 mg/dL (ref 70–99)
POTASSIUM: 3.8 meq/L (ref 3.7–5.3)
Sodium: 143 mEq/L (ref 137–147)
Total Protein: 7.1 g/dL (ref 6.0–8.3)

## 2013-11-21 LAB — SURGICAL PCR SCREEN
MRSA, PCR: NEGATIVE
Staphylococcus aureus: POSITIVE — AB

## 2013-11-21 LAB — APTT: aPTT: 27 seconds (ref 24–37)

## 2013-11-21 LAB — CBC
HCT: 38.4 % (ref 36.0–46.0)
Hemoglobin: 12.7 g/dL (ref 12.0–15.0)
MCH: 31 pg (ref 26.0–34.0)
MCHC: 33.1 g/dL (ref 30.0–36.0)
MCV: 93.7 fL (ref 78.0–100.0)
PLATELETS: 224 10*3/uL (ref 150–400)
RBC: 4.1 MIL/uL (ref 3.87–5.11)
RDW: 12.7 % (ref 11.5–15.5)
WBC: 6.8 10*3/uL (ref 4.0–10.5)

## 2013-11-21 LAB — PROTIME-INR
INR: 1.02 (ref 0.00–1.49)
Prothrombin Time: 13.4 seconds (ref 11.6–15.2)

## 2013-11-21 MED ORDER — ACETAMINOPHEN 10 MG/ML IV SOLN
1000.0000 mg | INTRAVENOUS | Status: AC
  Administered 2013-11-22: 1000 mg via INTRAVENOUS
  Filled 2013-11-21: qty 100

## 2013-11-21 MED ORDER — CEFAZOLIN SODIUM-DEXTROSE 2-3 GM-% IV SOLR
2.0000 g | INTRAVENOUS | Status: AC
  Administered 2013-11-22: 2 g via INTRAVENOUS
  Filled 2013-11-21: qty 50

## 2013-11-21 MED ORDER — DEXAMETHASONE SODIUM PHOSPHATE 4 MG/ML IJ SOLN
4.0000 mg | INTRAMUSCULAR | Status: AC
  Administered 2013-11-22: 4 mg via INTRAVENOUS
  Filled 2013-11-21: qty 1

## 2013-11-21 NOTE — Pre-Procedure Instructions (Signed)
Eduard ClosCatherine Claros  11/21/2013   Your procedure is scheduled on:  Oct 15th  [facility] Admitting at [phone].  Call this number if you have problems the morning of surgery: [phone]   Remember:   Do not eat food or drink liquids after midnight.   Take these medicines the morning of surgery with A SIP OF WATER: Fluoxetine (Prozac) and tramadol (Ultram) if needed  Stop taking Aspirin, Ibuprofen, BC's, Goody's, Herbal medications, Fish Oil   Do not wear jewelry, make-up or nail polish.  Do not wear lotions, powders, or perfumes. You may wear deodorant.  Do not shave 48 hours prior to surgery. Men may shave face and neck.  Do not bring valuables to the hospital.  [facility] is not responsible for any belongings or valuables.               Contacts, dentures or bridgework may not be worn into surgery.  Leave suitcase in the car. After surgery it may be brought to your room.  For patients admitted to the hospital, discharge time is determined by your treatment team.               Patients discharged the day of surgery will not be allowed to drive home.    Special Instructions: [place] - Preparing for Surgery  Before surgery, you can play an important role.  Because skin is not sterile, your skin needs to be as free of germs as possible.  You can reduce the number of germs on you skin by washing with CHG (chlorahexidine gluconate) soap before surgery.  CHG is an antiseptic cleaner which kills germs and bonds with the skin to continue killing germs even after washing.  Please DO NOT use if you have an allergy to CHG or antibacterial soaps.  If your skin becomes reddened/irritated stop using the CHG and inform your nurse when you arrive at Short Stay.  Do not shave (including legs and underarms) for at least 48 hours prior to the first CHG shower.  You may shave your face.  Please follow these instructions carefully:   1.  Shower with CHG Soap the night before  surgery and the  morning of Surgery.  2.  If you choose to wash your hair, wash your hair first as usual with your normal shampoo.  3.  After you shampoo, rinse your hair and body thoroughly to remove the Shampoo.  4.  Use CHG as you would any other liquid soap.  You can apply chg directly to the skin and wash gently with scrungie or a clean washcloth.  5.  Apply the CHG Soap to your body ONLY FROM THE NECK DOWN.  Do not use on open wounds or open sores.  Avoid contact with your eyes,       ears, mouth and genitals (private parts).  Wash genitals (private parts) with your normal soap.  6.  Wash thoroughly, paying special attention to the area where your surgery will be performed.  7.  Thoroughly rinse your body with warm water from the neck down.  8.  DO NOT shower/wash with your normal soap after using and rinsing off the CHG Soap.  9.  Pat yourself dry with a clean towel.            10.  Wear clean pajamas.            11.  Place clean sheets on your bed the night of your first  shower and do not sleep with pets.  Day of Surgery  Do not apply any lotions/deoderants the morning of surgery.  Please wear clean clothes to the hospital/surgery center.       Please read over the following fact sheets that you were given: Pain Booklet, Coughing and Deep Breathing, MRSA Information and Surgical Site Infection Prevention

## 2013-11-21 NOTE — Progress Notes (Signed)
PCP is Dr Burnell BlanksMaura [name] Denies seeing a cardiologist. Denies ever having a stress test, echo, or card cath. Denies having a recent CXR or EKG.

## 2013-11-22 ENCOUNTER — Observation Stay (HOSPITAL_COMMUNITY): Payer: Medicare Other

## 2013-11-22 ENCOUNTER — Ambulatory Visit (HOSPITAL_COMMUNITY): Payer: Medicare Other | Admitting: Anesthesiology

## 2013-11-22 ENCOUNTER — Encounter (HOSPITAL_COMMUNITY): Payer: Self-pay | Admitting: *Deleted

## 2013-11-22 ENCOUNTER — Encounter (HOSPITAL_COMMUNITY)
Admission: RE | Disposition: A | Discharge status: Home / Self Care | Payer: Self-pay | Source: Ambulatory Visit | Attending: Orthopedic Surgery

## 2013-11-22 ENCOUNTER — Ambulatory Visit (HOSPITAL_COMMUNITY): Payer: Medicare Other

## 2013-11-22 ENCOUNTER — Observation Stay (HOSPITAL_COMMUNITY)
Admission: RE | Admit: 2013-11-22 | Discharge: 2013-11-23 | Disposition: A | Discharge status: Home / Self Care | Payer: Medicare Other | Source: Ambulatory Visit | Attending: Orthopedic Surgery | Admitting: Orthopedic Surgery

## 2013-11-22 ENCOUNTER — Encounter (HOSPITAL_COMMUNITY): Payer: Medicare Other | Admitting: Anesthesiology

## 2013-11-22 ENCOUNTER — Ambulatory Visit: Payer: Self-pay | Admitting: Orthopedic Surgery

## 2013-11-22 DIAGNOSIS — M542 Cervicalgia: Secondary | ICD-10-CM | POA: Diagnosis not present

## 2013-11-22 DIAGNOSIS — M960 Pseudarthrosis after fusion or arthrodesis: Secondary | ICD-10-CM | POA: Diagnosis not present

## 2013-11-22 DIAGNOSIS — S12400K Unspecified displaced fracture of fifth cervical vertebra, subsequent encounter for fracture with nonunion: Secondary | ICD-10-CM | POA: Diagnosis not present

## 2013-11-22 DIAGNOSIS — M797 Fibromyalgia: Secondary | ICD-10-CM | POA: Insufficient documentation

## 2013-11-22 DIAGNOSIS — Z87891 Personal history of nicotine dependence: Secondary | ICD-10-CM | POA: Diagnosis not present

## 2013-11-22 DIAGNOSIS — M1990 Unspecified osteoarthritis, unspecified site: Secondary | ICD-10-CM | POA: Diagnosis not present

## 2013-11-22 DIAGNOSIS — F329 Major depressive disorder, single episode, unspecified: Secondary | ICD-10-CM | POA: Diagnosis not present

## 2013-11-22 DIAGNOSIS — Z79899 Other long term (current) drug therapy: Secondary | ICD-10-CM | POA: Insufficient documentation

## 2013-11-22 DIAGNOSIS — Z981 Arthrodesis status: Secondary | ICD-10-CM | POA: Diagnosis not present

## 2013-11-22 HISTORY — PX: CERVICAL FUSION: SHX112

## 2013-11-22 HISTORY — PX: POSTERIOR CERVICAL FUSION/FORAMINOTOMY: SHX5038

## 2013-11-22 SURGERY — POSTERIOR CERVICAL FUSION/FORAMINOTOMY LEVEL 2
Anesthesia: General

## 2013-11-22 MED ORDER — ROCURONIUM BROMIDE 100 MG/10ML IV SOLN
INTRAVENOUS | Status: DC | PRN
  Administered 2013-11-22: 50 mg via INTRAVENOUS

## 2013-11-22 MED ORDER — SODIUM CHLORIDE 0.9 % IJ SOLN: 3.0000 mL | INTRAMUSCULAR | Status: DC | PRN

## 2013-11-22 MED ORDER — DEXAMETHASONE SODIUM PHOSPHATE 4 MG/ML IJ SOLN
4.0000 mg | Freq: Four times a day (QID) | INTRAMUSCULAR | Status: DC
  Filled 2013-11-22 (×7): qty 1

## 2013-11-22 MED ORDER — PROPOFOL 10 MG/ML IV BOLUS
INTRAVENOUS | Status: DC | PRN
  Administered 2013-11-22: 150 mg via INTRAVENOUS

## 2013-11-22 MED ORDER — ONDANSETRON HCL 4 MG/2ML IJ SOLN: 4.0000 mg | INTRAMUSCULAR | Status: DC | PRN

## 2013-11-22 MED ORDER — SURGIFOAM 100 EX MISC
CUTANEOUS | Status: DC | PRN
  Administered 2013-11-22: 15:00:00 via TOPICAL

## 2013-11-22 MED ORDER — SODIUM CHLORIDE 0.9 % IJ SOLN: 3.0000 mL | Freq: Two times a day (BID) | INTRAMUSCULAR | Status: DC

## 2013-11-22 MED ORDER — LACTATED RINGERS IV SOLN
INTRAVENOUS | Status: DC | PRN
Start: 2013-11-22 — End: 2013-11-22
  Administered 2013-11-22 (×2): via INTRAVENOUS

## 2013-11-22 MED ORDER — DEXAMETHASONE 4 MG PO TABS
4.0000 mg | ORAL_TABLET | Freq: Four times a day (QID) | ORAL | Status: DC
  Administered 2013-11-22 – 2013-11-23 (×4): 4 mg via ORAL
  Filled 2013-11-22 (×7): qty 1

## 2013-11-22 MED ORDER — NEOSTIGMINE METHYLSULFATE 10 MG/10ML IV SOLN
INTRAVENOUS | Status: DC | PRN
  Administered 2013-11-22: 3 mg via INTRAVENOUS

## 2013-11-22 MED ORDER — HYDROMORPHONE HCL 1 MG/ML IJ SOLN
INTRAMUSCULAR | Status: AC
  Filled 2013-11-22: qty 1

## 2013-11-22 MED ORDER — PHENOL 1.4 % MT LIQD: 1.0000 | OROMUCOSAL | Status: DC | PRN

## 2013-11-22 MED ORDER — PROPOFOL 10 MG/ML IV BOLUS
INTRAVENOUS | Status: AC
  Filled 2013-11-22: qty 20

## 2013-11-22 MED ORDER — NEOSTIGMINE METHYLSULFATE 10 MG/10ML IV SOLN
INTRAVENOUS | Status: AC
  Filled 2013-11-22: qty 1

## 2013-11-22 MED ORDER — CEFAZOLIN SODIUM 1-5 GM-% IV SOLN
1.0000 g | Freq: Three times a day (TID) | INTRAVENOUS | Status: AC
  Administered 2013-11-22 – 2013-11-23 (×2): 1 g via INTRAVENOUS
  Filled 2013-11-22 (×2): qty 50

## 2013-11-22 MED ORDER — HYDROMORPHONE HCL 1 MG/ML IJ SOLN
0.2500 mg | INTRAMUSCULAR | Status: DC | PRN
  Administered 2013-11-22 (×3): 0.5 mg via INTRAVENOUS

## 2013-11-22 MED ORDER — PHENYLEPHRINE 40 MCG/ML (10ML) SYRINGE FOR IV PUSH (FOR BLOOD PRESSURE SUPPORT)
PREFILLED_SYRINGE | INTRAVENOUS | Status: AC
  Filled 2013-11-22: qty 10

## 2013-11-22 MED ORDER — LACTATED RINGERS IV SOLN
INTRAVENOUS | Status: DC
Start: 2013-11-22 — End: 2013-11-23
  Administered 2013-11-22: 13:00:00 via INTRAVENOUS

## 2013-11-22 MED ORDER — PHENYLEPHRINE HCL 10 MG/ML IJ SOLN
INTRAMUSCULAR | Status: DC | PRN
  Administered 2013-11-22 (×2): 80 ug via INTRAVENOUS

## 2013-11-22 MED ORDER — SODIUM CHLORIDE 0.9 % IV SOLN: 250.0000 mL | INTRAVENOUS | Status: DC

## 2013-11-22 MED ORDER — ACETAMINOPHEN 10 MG/ML IV SOLN
1000.0000 mg | Freq: Four times a day (QID) | INTRAVENOUS | Status: DC
  Administered 2013-11-23 (×3): 1000 mg via INTRAVENOUS
  Filled 2013-11-22 (×4): qty 100

## 2013-11-22 MED ORDER — THROMBIN 20000 UNITS EX SOLR
CUTANEOUS | Status: AC
  Filled 2013-11-22: qty 20000

## 2013-11-22 MED ORDER — MIDAZOLAM HCL 5 MG/5ML IJ SOLN
INTRAMUSCULAR | Status: DC | PRN
  Administered 2013-11-22: 2 mg via INTRAVENOUS

## 2013-11-22 MED ORDER — OXYCODONE HCL 5 MG PO TABS: 5.0000 mg | ORAL_TABLET | Freq: Once | ORAL | Status: DC | PRN

## 2013-11-22 MED ORDER — DIPHENHYDRAMINE HCL 50 MG/ML IJ SOLN
6.2500 mg | Freq: Four times a day (QID) | INTRAMUSCULAR | Status: DC | PRN
  Administered 2013-11-22: 6.25 mg via INTRAVENOUS

## 2013-11-22 MED ORDER — METHOCARBAMOL 500 MG PO TABS
500.0000 mg | ORAL_TABLET | Freq: Four times a day (QID) | ORAL | Status: DC | PRN
  Administered 2013-11-22 – 2013-11-23 (×2): 500 mg via ORAL
  Filled 2013-11-22 (×3): qty 1

## 2013-11-22 MED ORDER — MUPIROCIN 2 % EX OINT
1.0000 "application " | TOPICAL_OINTMENT | Freq: Two times a day (BID) | CUTANEOUS | Status: DC
  Administered 2013-11-22: 1 via TOPICAL

## 2013-11-22 MED ORDER — LIDOCAINE HCL (CARDIAC) 20 MG/ML IV SOLN
INTRAVENOUS | Status: DC | PRN
  Administered 2013-11-22: 100 mg via INTRAVENOUS

## 2013-11-22 MED ORDER — LACTATED RINGERS IV SOLN: INTRAVENOUS | Status: DC

## 2013-11-22 MED ORDER — BUPIVACAINE-EPINEPHRINE (PF) 0.25% -1:200000 IJ SOLN
INTRAMUSCULAR | Status: AC
  Filled 2013-11-22: qty 30

## 2013-11-22 MED ORDER — GLYCOPYRROLATE 0.2 MG/ML IJ SOLN
INTRAMUSCULAR | Status: DC | PRN
  Administered 2013-11-22: 0.4 mg via INTRAVENOUS

## 2013-11-22 MED ORDER — METHOCARBAMOL 1000 MG/10ML IJ SOLN
500.0000 mg | Freq: Four times a day (QID) | INTRAMUSCULAR | Status: DC | PRN
  Administered 2013-11-22: 500 mg via INTRAVENOUS
  Filled 2013-11-22: qty 5

## 2013-11-22 MED ORDER — DIPHENHYDRAMINE HCL 50 MG/ML IJ SOLN
INTRAMUSCULAR | Status: AC
  Filled 2013-11-22: qty 1

## 2013-11-22 MED ORDER — ONDANSETRON HCL 4 MG/2ML IJ SOLN
INTRAMUSCULAR | Status: AC
  Filled 2013-11-22: qty 2

## 2013-11-22 MED ORDER — FENTANYL CITRATE 0.05 MG/ML IJ SOLN
INTRAMUSCULAR | Status: AC
  Filled 2013-11-22: qty 5

## 2013-11-22 MED ORDER — MIDAZOLAM HCL 2 MG/2ML IJ SOLN
INTRAMUSCULAR | Status: AC
  Filled 2013-11-22: qty 2

## 2013-11-22 MED ORDER — OXYCODONE HCL 5 MG/5ML PO SOLN: 5.0000 mg | Freq: Once | ORAL | Status: DC | PRN

## 2013-11-22 MED ORDER — 0.9 % SODIUM CHLORIDE (POUR BTL) OPTIME
TOPICAL | Status: DC | PRN
  Administered 2013-11-22: 1000 mL

## 2013-11-22 MED ORDER — SUCCINYLCHOLINE CHLORIDE 20 MG/ML IJ SOLN
INTRAMUSCULAR | Status: AC
  Filled 2013-11-22: qty 1

## 2013-11-22 MED ORDER — FLUOXETINE HCL 20 MG PO TABS
20.0000 mg | ORAL_TABLET | Freq: Every day | ORAL | Status: DC
  Filled 2013-11-22 (×2): qty 1

## 2013-11-22 MED ORDER — GLYCOPYRROLATE 0.2 MG/ML IJ SOLN
INTRAMUSCULAR | Status: AC
  Filled 2013-11-22: qty 2

## 2013-11-22 MED ORDER — MUPIROCIN 2 % EX OINT
TOPICAL_OINTMENT | CUTANEOUS | Status: AC
  Filled 2013-11-22: qty 22

## 2013-11-22 MED ORDER — MORPHINE SULFATE 2 MG/ML IJ SOLN: 1.0000 mg | INTRAMUSCULAR | Status: DC | PRN

## 2013-11-22 MED ORDER — BUPIVACAINE-EPINEPHRINE 0.25% -1:200000 IJ SOLN
INTRAMUSCULAR | Status: DC | PRN
  Administered 2013-11-22: 10 mL

## 2013-11-22 MED ORDER — FENTANYL CITRATE 0.05 MG/ML IJ SOLN
INTRAMUSCULAR | Status: DC | PRN
  Administered 2013-11-22 (×2): 50 ug via INTRAVENOUS
  Administered 2013-11-22: 100 ug via INTRAVENOUS
  Administered 2013-11-22: 50 ug via INTRAVENOUS

## 2013-11-22 MED ORDER — MENTHOL 3 MG MT LOZG: 1.0000 | LOZENGE | OROMUCOSAL | Status: DC | PRN

## 2013-11-22 MED ORDER — OXYCODONE HCL 5 MG PO TABS
10.0000 mg | ORAL_TABLET | ORAL | Status: DC | PRN
  Administered 2013-11-22 – 2013-11-23 (×4): 10 mg via ORAL
  Filled 2013-11-22 (×4): qty 2

## 2013-11-22 SURGICAL SUPPLY — 90 items
ADH SKN CLS APL DERMABOND .7 (GAUZE/BANDAGES/DRESSINGS)
APL SKNCLS STERI-STRIP NONHPOA (GAUZE/BANDAGES/DRESSINGS) ×1
BAG DECANTER FOR FLEXI CONT (MISCELLANEOUS) ×2 IMPLANT
BENZOIN TINCTURE PRP APPL 2/3 (GAUZE/BANDAGES/DRESSINGS) ×2 IMPLANT
BIT DRILL MOUNTAINEER FIX 14 (BIT) ×1
BIT DRILL MOUNTAINEER FIX 14MM (BIT) IMPLANT
BIT DRILL MOUNTAINER FXED 12MM (DRILL) IMPLANT
BLADE SURG ROTATE 9660 (MISCELLANEOUS) IMPLANT
BONE MATRIX VIVIGEN 5CC (Bone Implant) ×3 IMPLANT
BUR MATCHSTICK NEURO 3.0 LAGG (BURR) ×2 IMPLANT
BUR ROUND FLUTED 4 SOFT TCH (BURR) IMPLANT
BUR ROUND FLUTED 4MM SOFT TCH (BURR)
CLOSURE STERI-STRIP 1/2X4 (GAUZE/BANDAGES/DRESSINGS) ×1
CLOSURE WOUND 1/2 X4 (GAUZE/BANDAGES/DRESSINGS)
CLSR STERI-STRIP ANTIMIC 1/2X4 (GAUZE/BANDAGES/DRESSINGS) ×1 IMPLANT
CORDS BIPOLAR (ELECTRODE) ×1 IMPLANT
COVER MAYO STAND STRL (DRAPES) ×8 IMPLANT
COVER SURGICAL LIGHT HANDLE (MISCELLANEOUS) ×3 IMPLANT
DERMABOND ADVANCED (GAUZE/BANDAGES/DRESSINGS)
DERMABOND ADVANCED .7 DNX12 (GAUZE/BANDAGES/DRESSINGS) ×1 IMPLANT
DRAPE C-ARM 42X72 X-RAY (DRAPES) ×4 IMPLANT
DRAPE POUCH INSTRU U-SHP 10X18 (DRAPES) ×3 IMPLANT
DRAPE SURG 17X23 STRL (DRAPES) ×3 IMPLANT
DRAPE TABLE COVER HEAVY DUTY (DRAPES) ×1 IMPLANT
DRAPE U-SHAPE 47X51 STRL (DRAPES) ×3 IMPLANT
DRILL BIT MOUNTAINEER FIX 14MM (BIT) ×3
DRILL MOUNTAINEER FIXED 12MM (DRILL) ×3
DRSG ADAPTIC 3X8 NADH LF (GAUZE/BANDAGES/DRESSINGS) ×1 IMPLANT
DRSG MEPILEX BORDER 4X4 (GAUZE/BANDAGES/DRESSINGS) ×2 IMPLANT
DRSG MEPILEX BORDER 4X8 (GAUZE/BANDAGES/DRESSINGS) ×1 IMPLANT
DURAPREP 26ML APPLICATOR (WOUND CARE) ×3 IMPLANT
ELECT BLADE 4.0 EZ CLEAN MEGAD (MISCELLANEOUS) ×3
ELECT BLADE 6.5 EXT (BLADE) ×1 IMPLANT
ELECT CAUTERY BLADE 6.4 (BLADE) ×3 IMPLANT
ELECT PENCIL ROCKER SW 15FT (MISCELLANEOUS) ×3 IMPLANT
ELECT REM PT RETURN 9FT ADLT (ELECTROSURGICAL) ×3
ELECTRODE BLDE 4.0 EZ CLN MEGD (MISCELLANEOUS) IMPLANT
ELECTRODE REM PT RTRN 9FT ADLT (ELECTROSURGICAL) ×1 IMPLANT
EVACUATOR 1/8 PVC DRAIN (DRAIN) IMPLANT
GLOVE BIOGEL PI IND STRL 8 (GLOVE) ×1 IMPLANT
GLOVE BIOGEL PI IND STRL 8.5 (GLOVE) ×1 IMPLANT
GLOVE BIOGEL PI INDICATOR 8 (GLOVE) ×2
GLOVE BIOGEL PI INDICATOR 8.5 (GLOVE) ×2
GLOVE ECLIPSE 8.5 STRL (GLOVE) ×3 IMPLANT
GLOVE ORTHO TXT STRL SZ7.5 (GLOVE) ×3 IMPLANT
GOWN STRL REUS W/ TWL LRG LVL3 (GOWN DISPOSABLE) ×1 IMPLANT
GOWN STRL REUS W/TWL 2XL LVL3 (GOWN DISPOSABLE) ×6 IMPLANT
GOWN STRL REUS W/TWL LRG LVL3 (GOWN DISPOSABLE) ×6
GRAFT BNE MATRIX VG 5 (Bone Implant) IMPLANT
IV CATH 14GX2 1/4 (CATHETERS) ×2 IMPLANT
KIT BASIN OR (CUSTOM PROCEDURE TRAY) ×3 IMPLANT
KIT POSITION SURG JACKSON T1 (MISCELLANEOUS) ×3 IMPLANT
KIT ROOM TURNOVER OR (KITS) ×3 IMPLANT
NEEDLE 22X1 1/2 (OR ONLY) (NEEDLE) ×5 IMPLANT
NS IRRIG 1000ML POUR BTL (IV SOLUTION) ×7 IMPLANT
NUT HH X CONN OUTER (Orthopedic Implant) ×4 IMPLANT
PACK LAMINECTOMY ORTHO (CUSTOM PROCEDURE TRAY) ×3 IMPLANT
PACK UNIVERSAL I (CUSTOM PROCEDURE TRAY) ×3 IMPLANT
PAD ARMBOARD 7.5X6 YLW CONV (MISCELLANEOUS) ×8 IMPLANT
PATTIES SURGICAL .5 X.5 (GAUZE/BANDAGES/DRESSINGS) ×2 IMPLANT
PATTIES SURGICAL .5 X1 (DISPOSABLE) ×3 IMPLANT
PLATE HH X CONN 28MM (Plate) ×2 IMPLANT
ROD MOUTAINEER 3.5X200MM (Rod) ×2 IMPLANT
SCREW F A 3.5X14 (Screw) ×8 IMPLANT
SCREW HH X CONN INNER (Screw) ×4 IMPLANT
SCREW INNER (Screw) ×4 IMPLANT
SCREW MOUNTAINEER ML 3.5X12MM (Screw) ×4 IMPLANT
SPONGE LAP 4X18 X RAY DECT (DISPOSABLE) ×6 IMPLANT
SPONGE SURGIFOAM ABS GEL 100 (HEMOSTASIS) ×3 IMPLANT
STRIP CLOSURE SKIN 1/2X4 (GAUZE/BANDAGES/DRESSINGS) IMPLANT
SURGIFLO TRUKIT (HEMOSTASIS) IMPLANT
SURGILUBE 2OZ TUBE FLIPTOP (MISCELLANEOUS) IMPLANT
SUT BONE WAX W31G (SUTURE) ×3 IMPLANT
SUT ETHILON 3 0 FSL (SUTURE) IMPLANT
SUT PDS AB 1 CTX 36 (SUTURE) ×1 IMPLANT
SUT PDS AB 2-0 CT1 27 (SUTURE) ×2 IMPLANT
SUT PROLENE 0 CT (SUTURE) ×1 IMPLANT
SUT PROLENE 2 0 CT2 30 (SUTURE) ×5 IMPLANT
SUT VIC AB 0 CTB1 27 (SUTURE) ×2 IMPLANT
SUT VIC AB 1 CTX 18 (SUTURE) ×2 IMPLANT
SUT VIC AB 1 CTX 36 (SUTURE) ×3
SUT VIC AB 1 CTX36XBRD ANBCTR (SUTURE) ×2 IMPLANT
SUT VIC AB 2-0 CT1 18 (SUTURE) ×3 IMPLANT
SYR BULB IRRIGATION 50ML (SYRINGE) ×1 IMPLANT
SYR CONTROL 10ML LL (SYRINGE) ×3 IMPLANT
TOWEL OR 17X24 6PK STRL BLUE (TOWEL DISPOSABLE) ×3 IMPLANT
TOWEL OR 17X26 10 PK STRL BLUE (TOWEL DISPOSABLE) ×3 IMPLANT
TRAY FOLEY CATH 16FRSI W/METER (SET/KITS/TRAYS/PACK) ×3 IMPLANT
WATER STERILE IRR 1000ML POUR (IV SOLUTION) ×1 IMPLANT
YANKAUER SUCT BULB TIP NO VENT (SUCTIONS) ×3 IMPLANT

## 2013-11-22 NOTE — Progress Notes (Signed)
Ortho Tech paged as ordered, this nurse informed that collar would be brought prior to pt going to surgery.

## 2013-11-22 NOTE — Anesthesia Preprocedure Evaluation (Signed)
Anesthesia Evaluation  Patient identified by MRN, date of birth, ID band Patient awake    Reviewed: Allergy & Precautions, H&P , NPO status , Patient's Chart, lab work & pertinent test results  History of Anesthesia Complications Negative for: history of anesthetic complications  Airway Mallampati: II TM Distance: >3 FB     Dental  (+) Missing,    Pulmonary neg shortness of breath, neg sleep apnea, neg COPDneg recent URI, former smoker,  breath sounds clear to auscultation        Cardiovascular negative cardio ROS  Rhythm:Regular     Neuro/Psych  Headaches, PSYCHIATRIC DISORDERS Depression Neck pain, no radiculopathy  Neuromuscular disease    GI/Hepatic Neg liver ROS,   Endo/Other  negative endocrine ROS  Renal/GU negative Renal ROS     Musculoskeletal  (+) Arthritis -, Fibromyalgia -  Abdominal   Peds  Hematology   Anesthesia Other Findings   Reproductive/Obstetrics                           Anesthesia Physical Anesthesia Plan  ASA: II  Anesthesia Plan: General   Post-op Pain Management:    Induction: Intravenous  Airway Management Planned: Oral ETT  Additional Equipment: None  Intra-op Plan:   Post-operative Plan: Extubation in OR  Informed Consent: I have reviewed the patients History and Physical, chart, labs and discussed the procedure including the risks, benefits and alternatives for the proposed anesthesia with the patient or authorized representative who has indicated his/her understanding and acceptance.   Dental advisory given  Plan Discussed with: CRNA and Surgeon  Anesthesia Plan Comments:         Anesthesia Quick Evaluation

## 2013-11-22 NOTE — Anesthesia Postprocedure Evaluation (Signed)
Anesthesia Post Note  Patient: April ClosCatherine [name]  Procedure(s) Performed: Procedure(s) (LRB): POSTERIOR C5-C7 FUSION     (LEVEL 2) (N/A)  Anesthesia type: General  Patient location: PACU  Post pain: Pain level controlled and Adequate analgesia  Post assessment: Post-op Vital signs reviewed, Patient's Cardiovascular Status Stable, Respiratory Function Stable, Patent Airway and Pain level controlled  Last Vitals:  Filed Vitals:   11/22/13 1634  BP: 142/84  Pulse: 68  Temp:   Resp: 10    Post vital signs: Reviewed and stable  Level of consciousness: awake, alert  and oriented  Complications: No apparent anesthesia complications

## 2013-11-22 NOTE — Transfer of Care (Signed)
Immediate Anesthesia Transfer of Care Note  Patient: Eduard ClosCatherine Nicklaus  Procedure(s) Performed: Procedure(s): POSTERIOR C5-C7 FUSION     (LEVEL 2) (N/A)  Patient Location: PACU  Anesthesia Type:General  Level of Consciousness: awake and alert   Airway & Oxygen Therapy: Patient Spontanous Breathing  Post-op Assessment: Report given to PACU RN and Post -op Vital signs reviewed and stable  Post vital signs: Reviewed and stable  Complications: No apparent anesthesia complications

## 2013-11-22 NOTE — Progress Notes (Signed)
Orthopedic Tech Progress Note Patient Details:  April [name] Feb 25, 1955 [id number] Called in order to [employer]. Patient ID: April [name], female   DOB: Feb 25, 1955, 58 y.o.   MRN: [id number]   Lesle ChrisGilliland, Jorene Kaylor L 11/22/2013, 3:38 PM

## 2013-11-22 NOTE — Brief Op Note (Signed)
11/22/2013  4:00 PM  PATIENT:  Eduard Closatherine Kue  58 y.o. female  PRE-OPERATIVE DIAGNOSIS:  PSEUDOARTHROSIS C5-C7   POST-OPERATIVE DIAGNOSIS:  PSEUDOARTHROSIS C5-C7   PROCEDURE:  Procedure(s): POSTERIOR C5-C7 FUSION     (LEVEL 2) (N/A)  SURGEON:  Surgeon(s) and Role:    * Venita Lickahari Taniyah Ballow, MD - Primary  PHYSICIAN ASSISTANT:   ASSISTANTS: Zonia Kiefjames owens   ANESTHESIA:   general  EBL:  Total I/O In: 1000 [I.V.:1000] Out: 175 [Urine:175]  BLOOD ADMINISTERED:none  DRAINS: none   LOCAL MEDICATIONS USED:  MARCAINE     SPECIMEN:  No Specimen  DISPOSITION OF SPECIMEN:  N/A  COUNTS:  YES  TOURNIQUET:  * No tourniquets in log *  DICTATION: .Other Dictation: Dictation Number [id number]  PLAN OF CARE: Admit for overnight observation  PATIENT DISPOSITION:  PACU - hemodynamically stable.

## 2013-11-23 ENCOUNTER — Encounter (HOSPITAL_COMMUNITY): Payer: Self-pay | Admitting: General Practice

## 2013-11-23 DIAGNOSIS — M960 Pseudarthrosis after fusion or arthrodesis: Secondary | ICD-10-CM | POA: Diagnosis not present

## 2013-11-23 MED ORDER — ONDANSETRON 4 MG PO TBDP: 4.0000 mg | ORAL_TABLET | Freq: Three times a day (TID) | ORAL | Status: AC | PRN

## 2013-11-23 MED ORDER — POLYETHYLENE GLYCOL 3350 17 G PO PACK: 17.0000 g | PACK | Freq: Every day | ORAL | Status: AC

## 2013-11-23 MED ORDER — OXYCODONE-ACETAMINOPHEN 10-325 MG PO TABS: 1.0000 | ORAL_TABLET | Freq: Four times a day (QID) | ORAL | Status: AC | PRN

## 2013-11-23 MED ORDER — FLUOXETINE HCL 20 MG PO CAPS
20.0000 mg | ORAL_CAPSULE | Freq: Every day | ORAL | Status: DC
  Administered 2013-11-23: 20 mg via ORAL
  Filled 2013-11-23: qty 1

## 2013-11-23 MED ORDER — DIAZEPAM 5 MG PO TABS: 5.0000 mg | ORAL_TABLET | Freq: Three times a day (TID) | ORAL | Status: AC | PRN

## 2013-11-23 MED ORDER — DOCUSATE SODIUM 100 MG PO CAPS: 100.0000 mg | ORAL_CAPSULE | Freq: Two times a day (BID) | ORAL | Status: AC

## 2013-11-23 NOTE — Progress Notes (Signed)
PT Cancellation Note  Patient Details Name: April [name] MRN: [id number] DOB: 1955/04/20   Cancelled Treatment:    Reason Eval/Treat Not Completed: PT screened, no needs identified, will sign off. Spoke with patient, OT who worked with patient and observed pt up in room.  OT completed all education.  Will d/c from PT.   Lynessa Almanzar LUBECK 11/23/2013, 10:05 AM

## 2013-11-23 NOTE — Op Note (Signed)
NAMCasilda [name]:  Riebe, Edward             ACCOUNT NO.:  [id number]  MEDICAL RECORD NO.:  [id number]  LOCATION:  [id number]                        FACILITY:  [facility]  PHYSICIAN:  Alvy Bealahari D Merville Hijazi, MD    DATE OF BIRTH:  [birth year]  DATE OF PROCEDURE:  11/22/2013 DATE OF DISCHARGE:                              OPERATIVE REPORT   PREOPERATIVE DIAGNOSIS:  Cervical pseudoarthrosis from previous anterior compression decompression and fusion, C5-C7.  POSTOPERATIVE DIAGNOSIS:  Cervical pseudoarthrosis from previous anterior compression decompression and fusion, C5-C7.  OPERATIVE PROCEDURE:  Posterior cervical spine fusion and instrumentation, C5-C7.  Instrumentation system used was a [employer]fighter[employer] Mountaineer System 14-mm lateral mass screws with a single cross-link utilizing a combination of allograft bone and autograft bone.  COMPLICATIONS:  None.  HISTORY:  This is a very pleasant 58 year old woman who several years ago had a non-instrumented ACDF.  The patient has continued to have progressive neck pain but no radicular arm pain.  CT scan demonstrated a pseudoarthrosis with collapse and loss of lordosis.  As a result, the pseudoarthrosis and the pain and the failure to improve with conservative measures, we elected to proceed with surgery.  All appropriate risks, benefits, and alternatives to surgery were discussed and consent was obtained.  FIRST ASSISTANT:  Genene ChurnJames M. Barry Dieneswens, [place].  OPERATIVE NOTE:  The patient was brought to the operating room, placed supine on the operating table.  After successful induction of general anesthesia and endotracheal intubation, TEDs, SCDs, and Foley were inserted.  The patient was turned prone [facility] the [facility] frame and the arms were tucked at the side.  The patient was placed in reverse Trendelenburg and the head was properly positioned and the neck was prepped and draped in a standard fashion.  X-ray was used to identify the incision sites by marking the C5 and C7  lateral masses.  A midline incision was infiltrated with a total of 10 mL of 0.25% Marcaine. Incision was made in the midline and sharp dissection was carried out down to the deep fascia.  I incised the deep fascia and I then stripped the paraspinal muscles to expose the C5, 6 and 7 spinous process lateral to lamina and facet complex.  The lateral masses were also exposed. This was achieved using Bovie electrocautery and a Cobb elevator.  Once I had bilateral exposure, I took an x-ray to confirm the C5 level. Using an awl, I placed it in the inferior and medial quadrant of the lateral mass and approached the cortex.  Once this was done, I then used a high-speed drill using a standard Magerl technique.  I aimed cephalad approximately 15-20 degrees and lateral approximately 10 degrees.  I then advanced a drill by tap drilling to ensure I had a solid bony canal.  I then palpated the drill hole with a ball tipped Feeler and then tapped and re-palpated with a ball-tipped Feeler.  I had a solid bony canal.  I repeated the same procedure at C6 and at C7.  I then on the contralateral side I repeated the same exact procedure at 5, 6, and 7.  Once I had all 6 screws drilled, tapped, and palpated, I then used a high-speed bur to  decorticate the C5-6 and C6-7 facet complex as well as decorticate the lamina.  I removed the majority of the spinous processes used for local bone graft.  At this point prior to burring, I did irrigate the wound copiously with normal saline.  I then burred and left all of the reamings in the wound to act as supplemental bone graft.  I then placed the screws.  All screw holes were placed 14 mm screws.  All screws had excellent purchase.  X-rays were taken and satisfactory positioning of the hardware.  I then contoured the rod and then placed them into the construct and then locked them into place.  I then torqued all of the rods appropriately.  Once the rod screw interface  was locked according to manufacturer's standard, I placed a single cross-link and locked that into place and torqued it off.  I then placed the bone graft along the posterior aspect of the spine. At this point, the wound was clean and dry.  There was no active bleeding.  I closed the deep fascia with interrupted #1 Vicryl sutures, superficial with 2-0 Vicryl suture and a 2-0 Prolene subcuticular. Steri-Strips and dry dressing were applied.  The patient was ultimately extubated, transferred to PACU without incident.  At the end of the case, all needle and sponge counts were correct.  There was no adverse intraoperative events.  First assistant was Zonia KiefJames Owens, instrumental in assisting with retraction, visualization, and wound closure.     Alvy Bealahari D Todrick Siedschlag, MD     DDB/MEDQ  D:  11/22/2013  T:  11/23/2013  Job:  [id number]

## 2013-11-23 NOTE — Care Management Note (Signed)
CARE MANAGEMENT NOTE 11/23/2013  Patient:  April [name],April [name]   Account Number:  [id number]  Date Initiated:  11/23/2013  Documentation initiated by:  Vance PeperBRADY,Caydn Justen  Subjective/Objective Assessment:   58 yr old female admitted with pseudoarthrosis of C5-C7, patient underwent C5-C7 posterior fusion.     Action/Plan:   No PT/OT home health needs identified by therapists. Do DME needs. Case manager signed off.   Anticipated DC Date:  11/23/2013   Anticipated DC Plan:  HOME/SELF CARE      DC Planning Services  CM consult      PAC Choice  NA   Choice offered to / List presented to:     DME arranged  NA        HH arranged  NA      Status of service:   Medicare Important Message given?   (If response is "NO", the following Medicare IM given date fields will be blank) Date Medicare IM given:   Medicare IM given by:   Date Additional Medicare IM given:   Additional Medicare IM given by:    Discharge Disposition:  HOME/SELF CARE  Per UR Regulation:  Reviewed for med. necessity/level of care/duration of stay  If discussed at Long Length of Stay Meetings, dates discussed:

## 2013-11-23 NOTE — Evaluation (Signed)
Occupational Therapy Evaluation and Discharge Patient Details Name: April [name] MRN: [id number] DOB: [birth year] Today's Date: 11/23/2013    History of Present Illness posterior fusion C5-7   Clinical Impression   This 58 yo female admitted and underwent above presents to acute OT with all education completed and no issues with mobility (PT made aware). Acute OT will sign off.    Follow Up Recommendations  No OT follow up    Equipment Recommendations  None recommended by OT       Precautions / Restrictions Precautions Precautions: Cervical Precaution Booklet Issued: Yes (comment) Restrictions Weight Bearing Restrictions: No      Mobility Bed Mobility Overal bed mobility: Modified Independent                Transfers Overall transfer level: Independent Equipment used: None                       ADL Overall ADL's : Modified independent                                       General ADL Comments: I instructed pt on how to change out pads, she was already aware of how to adjust collar vertically as well as horizontally. She is aware that it is recommended that she use a cup to spit in for oral care v. bending foreward to spit. She is aware she can have collar off to shower, eat, and while lying down               Pertinent Vitals/Pain Pain Assessment: No/denies pain     Hand Dominance Right   Extremity/Trunk Assessment Upper Extremity Assessment Upper Extremity Assessment: Overall WFL for tasks assessed   Lower Extremity Assessment Lower Extremity Assessment: Overall WFL for tasks assessed       Communication Communication Communication: No difficulties   Cognition Arousal/Alertness: Awake/alert Behavior During Therapy: WFL for tasks assessed/performed Overall Cognitive Status: Within Functional Limits for tasks assessed                                Home Living Family/patient expects to be discharged  to:: Private residence Living Arrangements: Other relatives Available Help at Discharge: Family Type of Home: House Home Access: Stairs to enter Secretary/administratorntrance Stairs-Number of Steps: 5 Entrance Stairs-Rails: Right Home Layout: One level     Bathroom Shower/Tub: Tub/shower unit;Curtain Shower/tub characteristics: Curtain       Home Equipment: Hand held shower head          Prior Functioning/Environment Level of Independence: Independent             OT Diagnosis: Generalized weakness         OT Goals(Current goals can be found in the care plan section) Acute Rehab OT Goals Patient Stated Goal: home today  OT Frequency:                End of Session Equipment Utilized During Treatment: Cervical collar Nurse Communication: Mobility status (pt safe to be up and about by herself)  Activity Tolerance: Patient tolerated treatment well Patient left: in chair   Time: [id number] OT Time Calculation (min): 32 min Charges:  OT General Charges $OT Visit: 1 Procedure OT Evaluation $Initial OT Evaluation Tier I: 1 Procedure OT Treatments $Self Care/Home Management : 23-37 mins G-Codes:  OT G-codes **NOT FOR INPATIENT CLASS** Functional Assessment Tool Used: clinical observation Functional Limitation: Self care Self Care Current Status (W0981(G8987): At least 1 percent but less than 20 percent impaired, limited or restricted Self Care Goal Status ([id number](G8988): At least 1 percent but less than 20 percent impaired, limited or restricted Self Care Discharge Status [phone](G8989): At least 1 percent but less than 20 percent impaired, limited or restricted  Evette GeorgesLeonard, [name] [phone] 11/23/2013, 11:12 AM

## 2013-11-23 NOTE — Discharge Instructions (Signed)

## 2013-11-23 NOTE — Progress Notes (Signed)
    Subjective: Procedure(s) (LRB): POSTERIOR C5-C7 FUSION     (LEVEL 2) (N/A) 1 Day Post-Op  Patient reports pain as 3 on 0-10 scale.  Reports none arm pain reports incisional neck pain   Positive void Negative bowel movement Positive flatus Negative chest pain or shortness of breath  Objective: Vital signs in last 24 hours: Temp:  [97.5 F (36.4 C)-98.9 F (37.2 C)] 98.9 F (37.2 C) (10/15 0109) Pulse Rate:  [57-84] 70 (10/15 0109) Resp:  [10-18] 16 (10/15 0109) BP: (134-164)/(63-95) 134/63 mmHg (10/15 0109) SpO2:  [92 %-100 %] 98 % (10/15 0109) Weight:  [60.056 kg (132 lb 6.4 oz)] 60.056 kg (132 lb 6.4 oz) (10/14 1126)  Intake/Output from previous day: 10/14 0701 - 10/15 0700 In: 1990 [P.O.:240; I.V.:1750] Out: 225 [Urine:175; Blood:50]  Labs:  Recent Labs  11/21/13 1558  WBC 6.8  RBC 4.10  HCT 38.4  PLT 224    Recent Labs  11/21/13 1558  NA 143  K 3.8  CL 103  CO2 28  BUN 16  CREATININE 0.79  GLUCOSE 87  CALCIUM 9.7    Recent Labs  11/21/13 1558  INR 1.02    Physical Exam: Neurologically intact ABD soft Neurovascular intact Intact pulses distally Incision: dressing C/D/I Compartment soft  Assessment/Plan: Patient stable  xrays satisfactory alignment  Hardware ok Mobilization with physical therapy Encourage incentive spirometry Continue care  Advance diet Up with therapy Doing well - plan on d/c today with percocet/valium/zofran F/u 2 weeks Collar when OOB  Venita Lickahari Corderius Saraceni, MD [facility] [phone]

## 2013-11-25 ENCOUNTER — Encounter (HOSPITAL_COMMUNITY): Payer: Self-pay | Admitting: Orthopedic Surgery

## 2013-12-19 NOTE — Discharge Summary (Signed)
Patient ID: April [name] MRN: [id number] DOB/AGE: [birth year] 58 y.o.  Admit date: 11/22/2013 Discharge date: 12/19/2013  Admission Diagnoses:  Active Problems:   Neck pain   Discharge Diagnoses:  Active Problems:   Neck pain  status post Procedure(s): POSTERIOR C5-C7 FUSION     (LEVEL 2)  Past Medical History  Diagnosis Date  . Arthritis   . Arthralgia of multiple sites   . Bursitis, subacromial   . Other cervical disc degeneration, cervicothoracic region   . Fibromyalgia   . Depression   . Disc disease, degenerative, lumbar or lumbosacral   . Headache     Surgeries: Procedure(s): POSTERIOR C5-C7 FUSION     (LEVEL 2) on 11/22/2013   Consultants:    Discharged Condition: Improved  Hospital Course: April [name] is an 58 y.o. female who was admitted 11/22/2013 for operative treatment of cevical ddd.  Patient failed conservative treatments (please see the history and physical for the specifics) and had severe unremitting pain that affects sleep, daily activities and work/hobbies. After pre-op clearance, the patient was taken to the operating room on 11/22/2013 and underwent  Procedure(s): POSTERIOR C5-C7 FUSION     (LEVEL 2).    Patient was given perioperative antibiotics:  Anti-infectives    Start     Dose/Rate Route Frequency Ordered Stop   11/22/13 2200  ceFAZolin (ANCEF) IVPB 1 g/50 mL premix     1 g100 mL/hr over 30 Minutes Intravenous Every 8 hours 11/22/13 1848 11/23/13 0625   11/21/13 1541  ceFAZolin (ANCEF) IVPB 2 g/50 mL premix     2 g100 mL/hr over 30 Minutes Intravenous 30 min pre-op 11/21/13 1541 11/22/13 1340       Patient was given sequential compression devices and early ambulation to prevent DVT.   Patient benefited maximally from hospital stay and there were no complications. At the time of discharge, the patient was urinating/moving their bowels without difficulty, tolerating a regular diet, pain is controlled with oral pain medications  and they have been cleared by PT/OT.   Recent vital signs: No data found.    Recent laboratory studies: No results for input(s): WBC, HGB, HCT, PLT, NA, K, CL, CO2, BUN, CREATININE, GLUCOSE, INR, CALCIUM in the last 72 hours.  Invalid input(s): PT, 2   Discharge Medications:     Medication List    STOP taking these medications        traMADol 50 MG tablet  Commonly known as:  ULTRAM      TAKE these medications        diazepam 5 MG tablet  Commonly known as:  VALIUM  Take 1 tablet (5 mg total) by mouth every 8 (eight) hours as needed for muscle spasms.     docusate sodium 100 MG capsule  Commonly known as:  COLACE  Take 1 capsule (100 mg total) by mouth 2 (two) times daily.     FLUoxetine 10 MG tablet  Commonly known as:  PROZAC  Take 20 mg by mouth daily.     ondansetron 4 MG disintegrating tablet  Commonly known as:  ZOFRAN ODT  Take 1 tablet (4 mg total) by mouth every 8 (eight) hours as needed.     oxyCODONE-acetaminophen 10-325 MG per tablet  Commonly known as:  PERCOCET  Take 1 tablet by mouth every 6 (six) hours as needed for pain.     polyethylene glycol packet  Commonly known as:  MIRALAX / GLYCOLAX  Take 17 g by mouth daily.  Diagnostic Studies: Dg Chest 2 View  11/21/2013   CLINICAL DATA:  Preop cervical fusion  EXAM: CHEST  2 VIEW  COMPARISON:  None.  FINDINGS: Lungs are essentially clear. No focal consolidation. No pleural effusion or pneumothorax.  The heart is normal in size.  Mild degenerative changes of the visualized thoracolumbar spine.  IMPRESSION: No evidence of acute cardiopulmonary disease.   Electronically Signed   By: Charline BillsSriyesh  Krishnan [name].D.   On: 11/21/2013 17:22   Dg Cervical Spine 2-3 Views  11/22/2013   CLINICAL DATA:  Posterior C5-C7 fusion  EXAM: DG C-ARM 61-120 MIN; CERVICAL SPINE - 2-3 VIEW  FLUOROSCOPY TIME:  30 seconds  COMPARISON:  None  FINDINGS: Posterior spinal fusion and decompression C5 through C7 without failure or  complication. Bilateral pedicle screws present at each level.  IMPRESSION: Posterior spinal fusion C5 through C7.   Electronically Signed   By: Elige KoHetal  Patel   On: 11/22/2013 18:01   Dg Cervical Spine 2 Or 3 Views  11/22/2013   CLINICAL DATA:  Spinal fusion.  EXAM: CERVICAL SPINE - 2-3 VIEW  COMPARISON:  10/03/2013.  FINDINGS: Patient has undergone prior posterior spinal fusion C5 through C7. Diffuse degenerative change. Good anatomic alignment.  IMPRESSION: Posterior spinal fusion C5 through C7.  Good anatomic alignment .   Electronically Signed   By: Maisie Fushomas  Register   On: 11/22/2013 17:27   Dg C-arm 61-120 Min  11/22/2013   CLINICAL DATA:  Posterior C5-C7 fusion  EXAM: DG C-ARM 61-120 MIN; CERVICAL SPINE - 2-3 VIEW  FLUOROSCOPY TIME:  30 seconds  COMPARISON:  None  FINDINGS: Posterior spinal fusion and decompression C5 through C7 without failure or complication. Bilateral pedicle screws present at each level.  IMPRESSION: Posterior spinal fusion C5 through C7.   Electronically Signed   By: Elige KoHetal  Patel   On: 11/22/2013 18:01          Follow-up Information    Schedule an appointment as soon as possible for a visit with April [name],Novalynn Branaman D, MD.   Specialty:  Orthopedic Surgery   Why:  needs return office visit 2 weeks postop   Contact information:   [address] [place] [address] [phone]       Discharge Plan:  discharge to home  Disposition:     Signed: [name],JAMES [name] for Dr. Venita Lickahari [facility] [phone] 12/19/2013, 1:35 PM     Agree with above F/u in office as arranged

## 2013-12-21 NOTE — Discharge Summary (Signed)
Patient ID: April [name] MRN: [id number] DOB/AGE: [birth year] 57 y.o.  Admit date: 11/22/2013 Discharge date: 12/21/2013  Admission Diagnoses:  Active Problems:   Neck pain   Discharge Diagnoses:  Active Problems:   Neck pain  status post Procedure(s): POSTERIOR C5-C7 FUSION     (LEVEL 2)  Past Medical History  Diagnosis Date  . Arthritis   . Arthralgia of multiple sites   . Bursitis, subacromial   . Other cervical disc degeneration, cervicothoracic region   . Fibromyalgia   . Depression   . Disc disease, degenerative, lumbar or lumbosacral   . Headache     Surgeries: Procedure(s): POSTERIOR C5-C7 FUSION     (LEVEL 2) on 11/22/2013   Consultants:    Discharged Condition: Improved  Hospital Course: April ClosCatherine Whittenberg is an 58 y.o. female who was admitted 11/22/2013 for operative treatment of neck pain.. Patient failed conservative treatments (please see the history and physical for the specifics) and had severe unremitting pain that affects sleep, daily activities and work/hobbies. After pre-op clearance, the patient was taken to the operating room on 11/22/2013 and underwent  Procedure(s): POSTERIOR C5-C7 FUSION     (LEVEL 2).    Patient was given perioperative antibiotics:  Anti-infectives    Start     Dose/Rate Route Frequency Ordered Stop   11/22/13 2200  ceFAZolin (ANCEF) IVPB 1 g/50 mL premix     1 g100 mL/hr over 30 Minutes Intravenous Every 8 hours 11/22/13 1848 11/23/13 0625   11/21/13 1541  ceFAZolin (ANCEF) IVPB 2 g/50 mL premix     2 g100 mL/hr over 30 Minutes Intravenous 30 min pre-op 11/21/13 1541 11/22/13 1340       Patient was given sequential compression devices and early ambulation to prevent DVT.   Patient benefited maximally from hospital stay and there were no complications. At the time of discharge, the patient was urinating/moving their bowels without difficulty, tolerating a regular diet, pain is controlled with oral pain medications  and they have been cleared by PT/OT.   Recent vital signs: No data found.    Recent laboratory studies: No results for input(s): WBC, HGB, HCT, PLT, NA, K, CL, CO2, BUN, CREATININE, GLUCOSE, INR, CALCIUM in the last 72 hours.  Invalid input(s): PT, 2   Discharge Medications:     Medication List    STOP taking these medications        traMADol 50 MG tablet  Commonly known as:  ULTRAM      TAKE these medications        diazepam 5 MG tablet  Commonly known as:  VALIUM  Take 1 tablet (5 mg total) by mouth every 8 (eight) hours as needed for muscle spasms.     docusate sodium 100 MG capsule  Commonly known as:  COLACE  Take 1 capsule (100 mg total) by mouth 2 (two) times daily.     FLUoxetine 10 MG tablet  Commonly known as:  PROZAC  Take 20 mg by mouth daily.     ondansetron 4 MG disintegrating tablet  Commonly known as:  ZOFRAN ODT  Take 1 tablet (4 mg total) by mouth every 8 (eight) hours as needed.     oxyCODONE-acetaminophen 10-325 MG per tablet  Commonly known as:  PERCOCET  Take 1 tablet by mouth every 6 (six) hours as needed for pain.     polyethylene glycol packet  Commonly known as:  MIRALAX / GLYCOLAX  Take 17 g by mouth daily.  Diagnostic Studies: Dg Cervical Spine 2-3 Views  11/22/2013   CLINICAL DATA:  Posterior C5-C7 fusion  EXAM: DG C-ARM 61-120 MIN; CERVICAL SPINE - 2-3 VIEW  FLUOROSCOPY TIME:  30 seconds  COMPARISON:  None  FINDINGS: Posterior spinal fusion and decompression C5 through C7 without failure or complication. Bilateral pedicle screws present at each level.  IMPRESSION: Posterior spinal fusion C5 through C7.   Electronically Signed   By: Elige KoHetal  Patel   On: 11/22/2013 18:01   Dg Cervical Spine 2 Or 3 Views  11/22/2013   CLINICAL DATA:  Spinal fusion.  EXAM: CERVICAL SPINE - 2-3 VIEW  COMPARISON:  10/03/2013.  FINDINGS: Patient has undergone prior posterior spinal fusion C5 through C7. Diffuse degenerative change. Good anatomic  alignment.  IMPRESSION: Posterior spinal fusion C5 through C7.  Good anatomic alignment .   Electronically Signed   By: Maisie Fushomas  Register   On: 11/22/2013 17:27   Dg C-arm 61-120 Min  11/22/2013   CLINICAL DATA:  Posterior C5-C7 fusion  EXAM: DG C-ARM 61-120 MIN; CERVICAL SPINE - 2-3 VIEW  FLUOROSCOPY TIME:  30 seconds  COMPARISON:  None  FINDINGS: Posterior spinal fusion and decompression C5 through C7 without failure or complication. Bilateral pedicle screws present at each level.  IMPRESSION: Posterior spinal fusion C5 through C7.   Electronically Signed   By: Elige KoHetal  Patel   On: 11/22/2013 18:01          Follow-up Information    Schedule an appointment as soon as possible for a visit with [name],Tambi Thole D, MD.   Specialty:  Orthopedic Surgery   Why:  needs return office visit 2 weeks postop   Contact information:   [address] [place] [address] [phone]       Discharge Plan:  discharge to home  Disposition:     Signed: Venita LickBROOKS,Jaidynn Balster D for Dr. Venita Lickahari Ayomide Purdy [facility] [phone] 12/21/2013, 5:10 PM

## 2014-01-02 DIAGNOSIS — Z9889 Other specified postprocedural states: Secondary | ICD-10-CM | POA: Diagnosis not present

## 2014-01-02 DIAGNOSIS — M960 Pseudarthrosis after fusion or arthrodesis: Secondary | ICD-10-CM | POA: Diagnosis not present

## 2014-01-16 DIAGNOSIS — F419 Anxiety disorder, unspecified: Secondary | ICD-10-CM | POA: Diagnosis not present

## 2014-02-13 DIAGNOSIS — M5126 Other intervertebral disc displacement, lumbar region: Secondary | ICD-10-CM | POA: Diagnosis not present

## 2014-02-13 DIAGNOSIS — Z4789 Encounter for other orthopedic aftercare: Secondary | ICD-10-CM | POA: Diagnosis not present

## 2014-02-14 DIAGNOSIS — F419 Anxiety disorder, unspecified: Secondary | ICD-10-CM | POA: Diagnosis not present

## 2014-02-14 DIAGNOSIS — Z6820 Body mass index (BMI) 20.0-20.9, adult: Secondary | ICD-10-CM | POA: Diagnosis not present

## 2014-02-14 DIAGNOSIS — M4696 Unspecified inflammatory spondylopathy, lumbar region: Secondary | ICD-10-CM | POA: Diagnosis not present

## 2014-03-06 ENCOUNTER — Ambulatory Visit: Payer: Self-pay | Admitting: Family Medicine

## 2014-03-06 DIAGNOSIS — R928 Other abnormal and inconclusive findings on diagnostic imaging of breast: Secondary | ICD-10-CM | POA: Diagnosis not present

## 2014-03-06 DIAGNOSIS — Z1231 Encounter for screening mammogram for malignant neoplasm of breast: Secondary | ICD-10-CM | POA: Diagnosis not present

## 2014-03-07 ENCOUNTER — Ambulatory Visit: Payer: Self-pay | Admitting: Family Medicine

## 2014-03-07 DIAGNOSIS — R922 Inconclusive mammogram: Secondary | ICD-10-CM | POA: Diagnosis not present

## 2014-03-07 DIAGNOSIS — N6489 Other specified disorders of breast: Secondary | ICD-10-CM | POA: Diagnosis not present

## 2014-03-09 DIAGNOSIS — M5116 Intervertebral disc disorders with radiculopathy, lumbar region: Secondary | ICD-10-CM | POA: Diagnosis not present

## 2014-04-30 DIAGNOSIS — M4696 Unspecified inflammatory spondylopathy, lumbar region: Secondary | ICD-10-CM | POA: Diagnosis not present

## 2014-04-30 DIAGNOSIS — F418 Other specified anxiety disorders: Secondary | ICD-10-CM | POA: Diagnosis not present

## 2014-05-01 DIAGNOSIS — M25561 Pain in right knee: Secondary | ICD-10-CM | POA: Diagnosis not present

## 2014-05-08 DIAGNOSIS — M5126 Other intervertebral disc displacement, lumbar region: Secondary | ICD-10-CM | POA: Diagnosis not present

## 2014-05-08 DIAGNOSIS — Z4789 Encounter for other orthopedic aftercare: Secondary | ICD-10-CM | POA: Diagnosis not present

## 2014-05-08 DIAGNOSIS — M5136 Other intervertebral disc degeneration, lumbar region: Secondary | ICD-10-CM | POA: Diagnosis not present

## 2014-05-14 ENCOUNTER — Emergency Department: Admit: 2014-05-14 | Disposition: A | Discharge status: Home / Self Care | Payer: Self-pay | Admitting: Student

## 2014-05-14 DIAGNOSIS — M25571 Pain in right ankle and joints of right foot: Secondary | ICD-10-CM | POA: Diagnosis not present

## 2014-05-14 DIAGNOSIS — Z87891 Personal history of nicotine dependence: Secondary | ICD-10-CM | POA: Diagnosis not present

## 2014-05-17 DIAGNOSIS — M109 Gout, unspecified: Secondary | ICD-10-CM | POA: Diagnosis not present

## 2014-05-25 DIAGNOSIS — Z981 Arthrodesis status: Secondary | ICD-10-CM | POA: Diagnosis not present

## 2014-05-25 DIAGNOSIS — M542 Cervicalgia: Secondary | ICD-10-CM | POA: Diagnosis not present

## 2014-06-11 DIAGNOSIS — M19012 Primary osteoarthritis, left shoulder: Secondary | ICD-10-CM | POA: Diagnosis not present

## 2014-06-11 DIAGNOSIS — M7541 Impingement syndrome of right shoulder: Secondary | ICD-10-CM | POA: Diagnosis not present

## 2014-06-11 DIAGNOSIS — M19011 Primary osteoarthritis, right shoulder: Secondary | ICD-10-CM | POA: Diagnosis not present

## 2014-06-11 DIAGNOSIS — M25512 Pain in left shoulder: Secondary | ICD-10-CM | POA: Diagnosis not present

## 2014-07-11 DIAGNOSIS — M19011 Primary osteoarthritis, right shoulder: Secondary | ICD-10-CM | POA: Diagnosis not present

## 2014-07-11 DIAGNOSIS — M7541 Impingement syndrome of right shoulder: Secondary | ICD-10-CM | POA: Diagnosis not present

## 2014-07-11 DIAGNOSIS — M7542 Impingement syndrome of left shoulder: Secondary | ICD-10-CM | POA: Diagnosis not present

## 2014-07-12 DIAGNOSIS — Z4789 Encounter for other orthopedic aftercare: Secondary | ICD-10-CM | POA: Diagnosis not present

## 2014-07-12 DIAGNOSIS — Z9889 Other specified postprocedural states: Secondary | ICD-10-CM | POA: Diagnosis not present

## 2014-07-19 DIAGNOSIS — M542 Cervicalgia: Secondary | ICD-10-CM | POA: Diagnosis not present

## 2014-07-31 DIAGNOSIS — Z9889 Other specified postprocedural states: Secondary | ICD-10-CM | POA: Diagnosis not present

## 2014-07-31 DIAGNOSIS — Z4789 Encounter for other orthopedic aftercare: Secondary | ICD-10-CM | POA: Diagnosis not present

## 2014-08-13 IMAGING — CR DG LUMBAR SPINE COMPLETE 4+V
5 series · 5 of 5 positions shown · non-contrast
Comparison: None.

CLINICAL DATA: Low back pain

LUMBAR SPINE - COMPLETE 4+ VIEW

[view not recorded (1 of 5)]
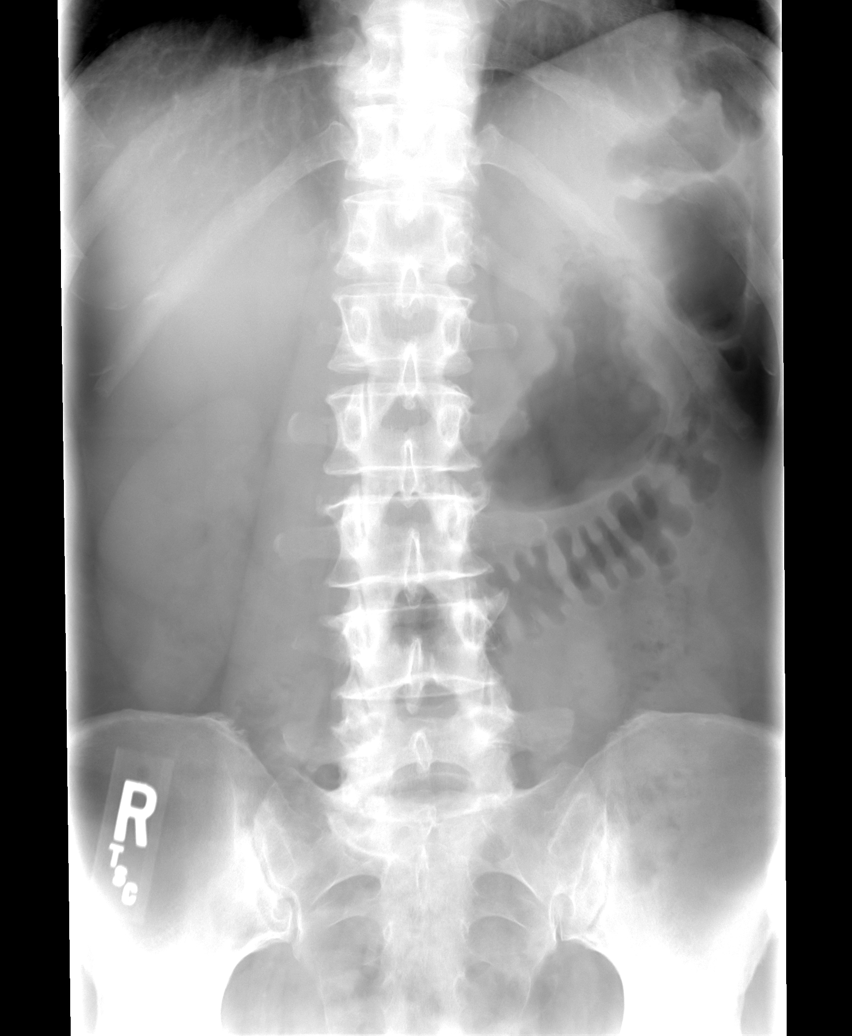

[view not recorded (2 of 5)]
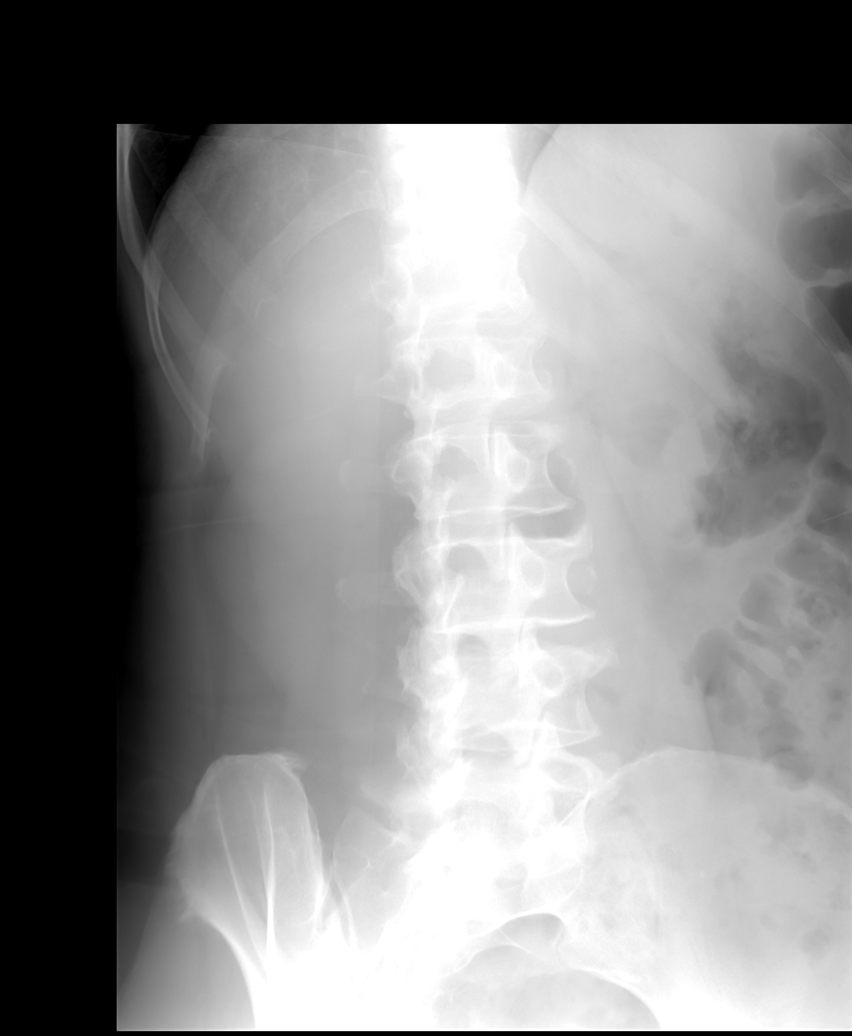

[view not recorded (3 of 5)]
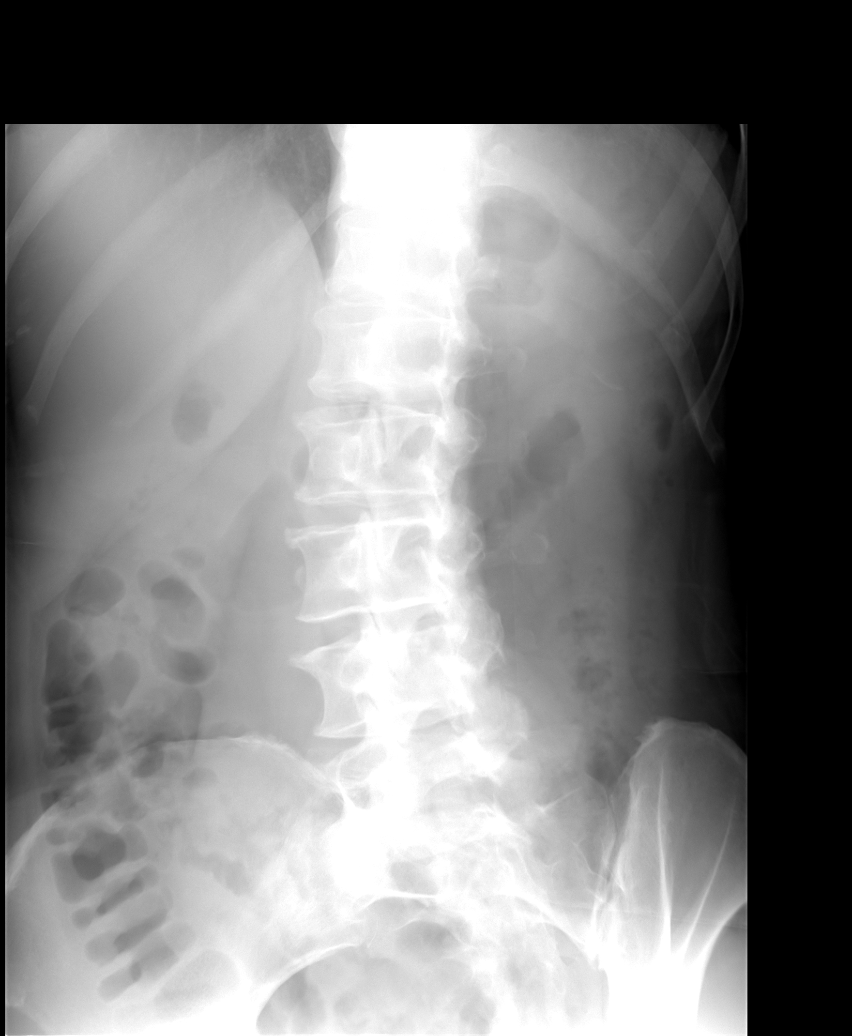

[view not recorded (4 of 5)]
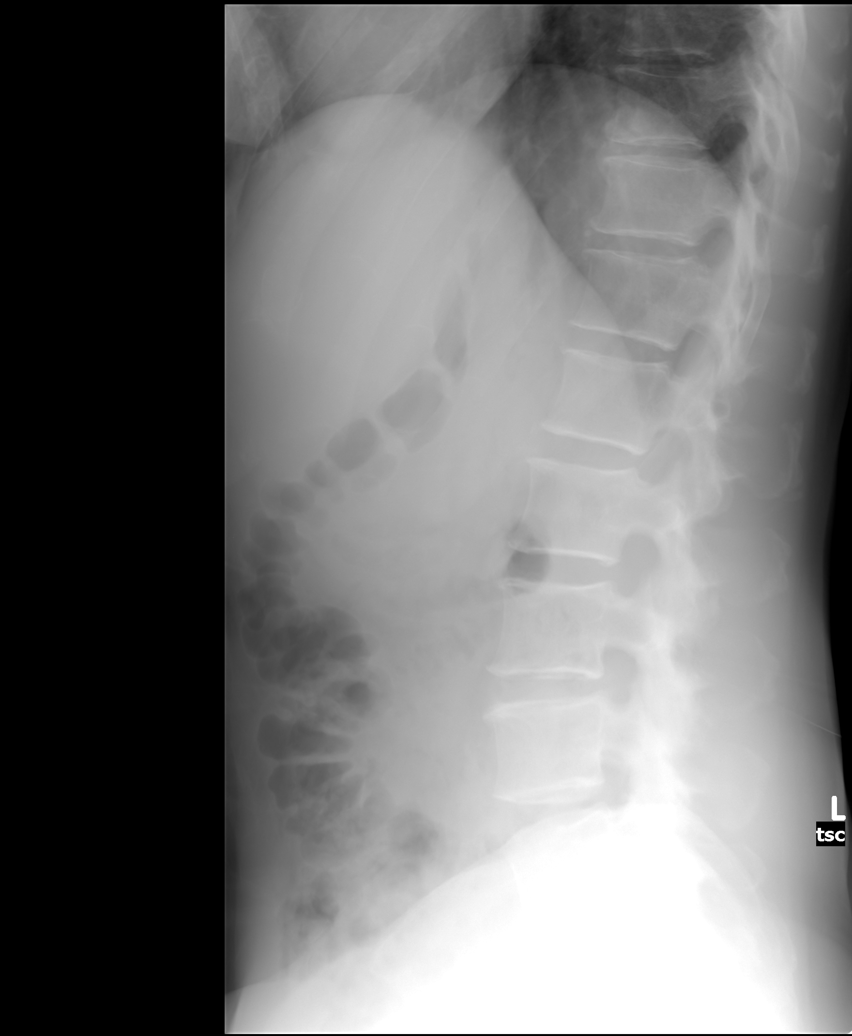

[view not recorded (5 of 5)]
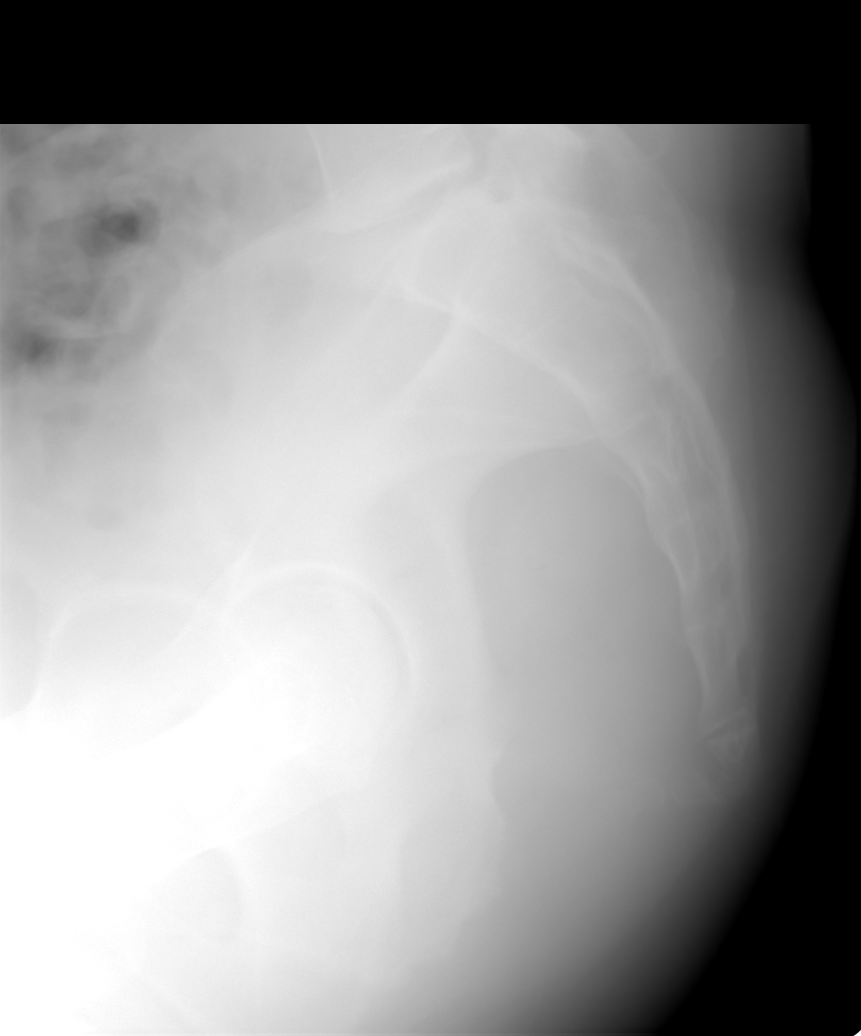

[5 of 5 positions shown; findings below may reference images not displayed]

FINDINGS: Frontal, lateral, spot lumbosacral lateral, and bilateral
oblique views were obtained.  There are five non-rib bearing lumbar
type vertebral bodies.  There is mild dextrorotoscoliosis. There is
no fracture or spondylolisthesis.  There is a moderate disc space
narrowing at L4-5 and L5 - S1. Other disc spaces appear normal.
There is facet osteoarthritic change at L3-4, L4-5, and L5-S1
bilaterally.
IMPRESSION: Osteoarthritic change.  Mild scoliosis.  No fracture
or spondylolisthesis.

## 2014-08-14 DIAGNOSIS — M7541 Impingement syndrome of right shoulder: Secondary | ICD-10-CM | POA: Diagnosis not present

## 2014-08-21 DIAGNOSIS — Z4789 Encounter for other orthopedic aftercare: Secondary | ICD-10-CM | POA: Diagnosis not present

## 2014-08-28 DIAGNOSIS — M5082 Other cervical disc disorders, mid-cervical region: Secondary | ICD-10-CM | POA: Diagnosis not present

## 2014-09-14 DIAGNOSIS — Z9889 Other specified postprocedural states: Secondary | ICD-10-CM | POA: Diagnosis not present

## 2014-09-14 DIAGNOSIS — H1789 Other corneal scars and opacities: Secondary | ICD-10-CM | POA: Diagnosis not present

## 2014-09-14 DIAGNOSIS — H04123 Dry eye syndrome of bilateral lacrimal glands: Secondary | ICD-10-CM | POA: Diagnosis not present

## 2014-09-14 DIAGNOSIS — H1045 Other chronic allergic conjunctivitis: Secondary | ICD-10-CM | POA: Diagnosis not present

## 2014-09-14 DIAGNOSIS — H25812 Combined forms of age-related cataract, left eye: Secondary | ICD-10-CM | POA: Diagnosis not present

## 2014-10-02 DIAGNOSIS — M960 Pseudarthrosis after fusion or arthrodesis: Secondary | ICD-10-CM | POA: Diagnosis not present

## 2014-10-02 DIAGNOSIS — M1711 Unilateral primary osteoarthritis, right knee: Secondary | ICD-10-CM | POA: Diagnosis not present

## 2014-10-02 DIAGNOSIS — Z9889 Other specified postprocedural states: Secondary | ICD-10-CM | POA: Diagnosis not present

## 2014-10-02 DIAGNOSIS — M5136 Other intervertebral disc degeneration, lumbar region: Secondary | ICD-10-CM | POA: Diagnosis not present

## 2014-10-08 ENCOUNTER — Other Ambulatory Visit: Payer: Self-pay | Admitting: Orthopedic Surgery

## 2014-10-08 DIAGNOSIS — M960 Pseudarthrosis after fusion or arthrodesis: Secondary | ICD-10-CM

## 2014-10-12 DIAGNOSIS — M5011 Cervical disc disorder with radiculopathy,  high cervical region: Secondary | ICD-10-CM | POA: Diagnosis not present

## 2014-10-12 DIAGNOSIS — M961 Postlaminectomy syndrome, not elsewhere classified: Secondary | ICD-10-CM | POA: Diagnosis not present

## 2014-10-12 DIAGNOSIS — M47812 Spondylosis without myelopathy or radiculopathy, cervical region: Secondary | ICD-10-CM | POA: Diagnosis not present

## 2014-10-12 DIAGNOSIS — M4722 Other spondylosis with radiculopathy, cervical region: Secondary | ICD-10-CM | POA: Diagnosis not present

## 2014-10-12 DIAGNOSIS — M5010 Cervical disc disorder with radiculopathy, unspecified cervical region: Secondary | ICD-10-CM | POA: Diagnosis not present

## 2014-10-23 ENCOUNTER — Ambulatory Visit
Admission: RE | Admit: 2014-10-23 | Discharge: 2014-10-23 | Disposition: A | Discharge status: Home / Self Care | Payer: Medicare Other | Source: Ambulatory Visit | Attending: Orthopedic Surgery | Admitting: Orthopedic Surgery

## 2014-10-23 DIAGNOSIS — Z9889 Other specified postprocedural states: Secondary | ICD-10-CM | POA: Diagnosis not present

## 2014-10-23 DIAGNOSIS — M960 Pseudarthrosis after fusion or arthrodesis: Secondary | ICD-10-CM | POA: Diagnosis not present

## 2014-10-23 DIAGNOSIS — M5136 Other intervertebral disc degeneration, lumbar region: Secondary | ICD-10-CM | POA: Diagnosis not present

## 2014-10-23 DIAGNOSIS — M542 Cervicalgia: Secondary | ICD-10-CM | POA: Diagnosis not present

## 2014-10-31 DIAGNOSIS — Z681 Body mass index (BMI) 19 or less, adult: Secondary | ICD-10-CM | POA: Diagnosis not present

## 2014-10-31 DIAGNOSIS — G4700 Insomnia, unspecified: Secondary | ICD-10-CM | POA: Diagnosis not present

## 2014-10-31 DIAGNOSIS — F418 Other specified anxiety disorders: Secondary | ICD-10-CM | POA: Diagnosis not present

## 2014-11-09 DIAGNOSIS — M4726 Other spondylosis with radiculopathy, lumbar region: Secondary | ICD-10-CM | POA: Diagnosis not present

## 2014-11-09 DIAGNOSIS — M5136 Other intervertebral disc degeneration, lumbar region: Secondary | ICD-10-CM | POA: Diagnosis not present

## 2014-11-19 DIAGNOSIS — Z4789 Encounter for other orthopedic aftercare: Secondary | ICD-10-CM | POA: Diagnosis not present

## 2014-11-23 DIAGNOSIS — G8929 Other chronic pain: Secondary | ICD-10-CM | POA: Diagnosis not present

## 2014-11-23 DIAGNOSIS — M25511 Pain in right shoulder: Secondary | ICD-10-CM | POA: Diagnosis not present

## 2014-12-03 DIAGNOSIS — M7541 Impingement syndrome of right shoulder: Secondary | ICD-10-CM | POA: Diagnosis not present

## 2014-12-03 DIAGNOSIS — M19011 Primary osteoarthritis, right shoulder: Secondary | ICD-10-CM | POA: Diagnosis not present

## 2014-12-11 DIAGNOSIS — Z23 Encounter for immunization: Secondary | ICD-10-CM | POA: Diagnosis not present

## 2014-12-11 DIAGNOSIS — Z6820 Body mass index (BMI) 20.0-20.9, adult: Secondary | ICD-10-CM | POA: Diagnosis not present

## 2014-12-11 DIAGNOSIS — H938X1 Other specified disorders of right ear: Secondary | ICD-10-CM | POA: Diagnosis not present

## 2014-12-11 DIAGNOSIS — G4700 Insomnia, unspecified: Secondary | ICD-10-CM | POA: Diagnosis not present

## 2014-12-11 DIAGNOSIS — M545 Low back pain: Secondary | ICD-10-CM | POA: Diagnosis not present

## 2014-12-25 DIAGNOSIS — M1711 Unilateral primary osteoarthritis, right knee: Secondary | ICD-10-CM | POA: Diagnosis not present

## 2015-01-08 DIAGNOSIS — M75111 Incomplete rotator cuff tear or rupture of right shoulder, not specified as traumatic: Secondary | ICD-10-CM | POA: Diagnosis not present

## 2015-01-08 DIAGNOSIS — G8918 Other acute postprocedural pain: Secondary | ICD-10-CM | POA: Diagnosis not present

## 2015-01-08 DIAGNOSIS — M19011 Primary osteoarthritis, right shoulder: Secondary | ICD-10-CM | POA: Diagnosis not present

## 2015-01-08 DIAGNOSIS — M7541 Impingement syndrome of right shoulder: Secondary | ICD-10-CM | POA: Diagnosis not present

## 2015-01-15 DIAGNOSIS — Z4789 Encounter for other orthopedic aftercare: Secondary | ICD-10-CM | POA: Diagnosis not present

## 2015-01-15 DIAGNOSIS — M12811 Other specific arthropathies, not elsewhere classified, right shoulder: Secondary | ICD-10-CM | POA: Diagnosis not present

## 2015-01-21 DIAGNOSIS — Z4789 Encounter for other orthopedic aftercare: Secondary | ICD-10-CM | POA: Diagnosis not present

## 2015-01-21 DIAGNOSIS — M12811 Other specific arthropathies, not elsewhere classified, right shoulder: Secondary | ICD-10-CM | POA: Diagnosis not present

## 2015-02-07 DIAGNOSIS — M25511 Pain in right shoulder: Secondary | ICD-10-CM | POA: Diagnosis not present

## 2015-03-14 DIAGNOSIS — M1711 Unilateral primary osteoarthritis, right knee: Secondary | ICD-10-CM | POA: Diagnosis not present

## 2015-03-21 DIAGNOSIS — M1711 Unilateral primary osteoarthritis, right knee: Secondary | ICD-10-CM | POA: Diagnosis not present

## 2015-03-21 DIAGNOSIS — M25561 Pain in right knee: Secondary | ICD-10-CM | POA: Diagnosis not present

## 2015-03-28 DIAGNOSIS — M1711 Unilateral primary osteoarthritis, right knee: Secondary | ICD-10-CM | POA: Diagnosis not present

## 2015-05-22 DIAGNOSIS — M1812 Unilateral primary osteoarthritis of first carpometacarpal joint, left hand: Secondary | ICD-10-CM | POA: Diagnosis not present

## 2015-07-22 DIAGNOSIS — M1712 Unilateral primary osteoarthritis, left knee: Secondary | ICD-10-CM | POA: Diagnosis not present

## 2015-07-22 DIAGNOSIS — M1711 Unilateral primary osteoarthritis, right knee: Secondary | ICD-10-CM | POA: Diagnosis not present

## 2015-08-01 DIAGNOSIS — M4802 Spinal stenosis, cervical region: Secondary | ICD-10-CM | POA: Diagnosis not present

## 2015-08-01 DIAGNOSIS — M19011 Primary osteoarthritis, right shoulder: Secondary | ICD-10-CM | POA: Diagnosis not present

## 2015-10-30 DIAGNOSIS — H04123 Dry eye syndrome of bilateral lacrimal glands: Secondary | ICD-10-CM | POA: Diagnosis not present

## 2015-10-30 DIAGNOSIS — H1789 Other corneal scars and opacities: Secondary | ICD-10-CM | POA: Diagnosis not present

## 2015-10-30 DIAGNOSIS — H25812 Combined forms of age-related cataract, left eye: Secondary | ICD-10-CM | POA: Diagnosis not present

## 2015-10-30 DIAGNOSIS — Z9889 Other specified postprocedural states: Secondary | ICD-10-CM | POA: Diagnosis not present

## 2015-12-16 DIAGNOSIS — Z23 Encounter for immunization: Secondary | ICD-10-CM | POA: Diagnosis not present
